# Patient Record
Sex: Male | Born: 1960 | Hispanic: No | Marital: Married | Smoking: Never smoker
Health system: Southern US, Community
[De-identification: ages and names within clinical notes are randomized; demographics above are authoritative.]

## PROBLEM LIST (undated history)

## (undated) DIAGNOSIS — E119 Type 2 diabetes mellitus without complications: Secondary | ICD-10-CM

## (undated) DIAGNOSIS — A048 Other specified bacterial intestinal infections: Secondary | ICD-10-CM

## (undated) DIAGNOSIS — I10 Essential (primary) hypertension: Secondary | ICD-10-CM

## (undated) DIAGNOSIS — I119 Hypertensive heart disease without heart failure: Secondary | ICD-10-CM

## (undated) DIAGNOSIS — N419 Inflammatory disease of prostate, unspecified: Secondary | ICD-10-CM

## (undated) DIAGNOSIS — E785 Hyperlipidemia, unspecified: Secondary | ICD-10-CM

## (undated) HISTORY — DX: Inflammatory disease of prostate, unspecified: N41.9

## (undated) HISTORY — DX: Essential (primary) hypertension: I10

## (undated) HISTORY — DX: Other specified bacterial intestinal infections: A04.8

## (undated) HISTORY — DX: Hypertensive heart disease without heart failure: I11.9

## (undated) HISTORY — PX: ESOPHAGOGASTRODUODENOSCOPY: SHX1529

## (undated) HISTORY — PX: CATARACT EXTRACTION, BILATERAL: SHX1313

## (undated) HISTORY — PX: COLONOSCOPY: SHX174

## (undated) HISTORY — DX: Hyperlipidemia, unspecified: E78.5

## (undated) HISTORY — DX: Type 2 diabetes mellitus without complications: E11.9

## (undated) HISTORY — PX: NOSE SURGERY: SHX723

---

## 2016-05-28 DIAGNOSIS — L578 Other skin changes due to chronic exposure to nonionizing radiation: Secondary | ICD-10-CM | POA: Diagnosis not present

## 2016-05-28 DIAGNOSIS — D225 Melanocytic nevi of trunk: Secondary | ICD-10-CM | POA: Diagnosis not present

## 2016-05-28 DIAGNOSIS — D485 Neoplasm of uncertain behavior of skin: Secondary | ICD-10-CM | POA: Diagnosis not present

## 2016-06-27 DIAGNOSIS — H01004 Unspecified blepharitis left upper eyelid: Secondary | ICD-10-CM | POA: Diagnosis not present

## 2016-06-27 DIAGNOSIS — E119 Type 2 diabetes mellitus without complications: Secondary | ICD-10-CM | POA: Diagnosis not present

## 2016-06-27 DIAGNOSIS — H01001 Unspecified blepharitis right upper eyelid: Secondary | ICD-10-CM | POA: Diagnosis not present

## 2016-06-27 DIAGNOSIS — H25813 Combined forms of age-related cataract, bilateral: Secondary | ICD-10-CM | POA: Diagnosis not present

## 2016-07-23 DIAGNOSIS — G4730 Sleep apnea, unspecified: Secondary | ICD-10-CM | POA: Diagnosis not present

## 2016-07-26 DIAGNOSIS — Z23 Encounter for immunization: Secondary | ICD-10-CM | POA: Diagnosis not present

## 2016-07-31 DIAGNOSIS — E119 Type 2 diabetes mellitus without complications: Secondary | ICD-10-CM | POA: Diagnosis not present

## 2016-07-31 DIAGNOSIS — Z6827 Body mass index (BMI) 27.0-27.9, adult: Secondary | ICD-10-CM | POA: Diagnosis not present

## 2016-07-31 DIAGNOSIS — E785 Hyperlipidemia, unspecified: Secondary | ICD-10-CM | POA: Diagnosis not present

## 2016-07-31 DIAGNOSIS — I10 Essential (primary) hypertension: Secondary | ICD-10-CM | POA: Diagnosis not present

## 2016-08-28 DIAGNOSIS — G4733 Obstructive sleep apnea (adult) (pediatric): Secondary | ICD-10-CM | POA: Diagnosis not present

## 2016-09-28 DIAGNOSIS — G4733 Obstructive sleep apnea (adult) (pediatric): Secondary | ICD-10-CM | POA: Diagnosis not present

## 2016-10-28 DIAGNOSIS — G4733 Obstructive sleep apnea (adult) (pediatric): Secondary | ICD-10-CM | POA: Diagnosis not present

## 2016-11-29 DIAGNOSIS — G4733 Obstructive sleep apnea (adult) (pediatric): Secondary | ICD-10-CM | POA: Diagnosis not present

## 2017-01-06 DIAGNOSIS — I10 Essential (primary) hypertension: Secondary | ICD-10-CM | POA: Diagnosis not present

## 2017-01-06 DIAGNOSIS — E785 Hyperlipidemia, unspecified: Secondary | ICD-10-CM | POA: Diagnosis not present

## 2017-01-06 DIAGNOSIS — E119 Type 2 diabetes mellitus without complications: Secondary | ICD-10-CM | POA: Diagnosis not present

## 2017-08-18 DIAGNOSIS — Z23 Encounter for immunization: Secondary | ICD-10-CM | POA: Diagnosis not present

## 2017-08-18 DIAGNOSIS — E785 Hyperlipidemia, unspecified: Secondary | ICD-10-CM | POA: Diagnosis not present

## 2017-08-18 DIAGNOSIS — Z0000 Encounter for general adult medical examination without abnormal findings: Secondary | ICD-10-CM | POA: Diagnosis not present

## 2017-08-18 DIAGNOSIS — E119 Type 2 diabetes mellitus without complications: Secondary | ICD-10-CM | POA: Diagnosis not present

## 2017-08-18 DIAGNOSIS — I10 Essential (primary) hypertension: Secondary | ICD-10-CM | POA: Diagnosis not present

## 2017-08-19 DIAGNOSIS — H01001 Unspecified blepharitis right upper eyelid: Secondary | ICD-10-CM | POA: Diagnosis not present

## 2017-08-19 DIAGNOSIS — H25813 Combined forms of age-related cataract, bilateral: Secondary | ICD-10-CM | POA: Diagnosis not present

## 2017-08-19 DIAGNOSIS — H01004 Unspecified blepharitis left upper eyelid: Secondary | ICD-10-CM | POA: Diagnosis not present

## 2017-08-19 DIAGNOSIS — E119 Type 2 diabetes mellitus without complications: Secondary | ICD-10-CM | POA: Diagnosis not present

## 2018-01-02 DIAGNOSIS — R5383 Other fatigue: Secondary | ICD-10-CM | POA: Diagnosis not present

## 2018-01-02 DIAGNOSIS — J329 Chronic sinusitis, unspecified: Secondary | ICD-10-CM | POA: Diagnosis not present

## 2018-05-26 DIAGNOSIS — E119 Type 2 diabetes mellitus without complications: Secondary | ICD-10-CM | POA: Diagnosis not present

## 2018-05-26 DIAGNOSIS — L84 Corns and callosities: Secondary | ICD-10-CM | POA: Diagnosis not present

## 2018-05-26 DIAGNOSIS — B353 Tinea pedis: Secondary | ICD-10-CM | POA: Diagnosis not present

## 2018-06-11 ENCOUNTER — Ambulatory Visit: Payer: BLUE CROSS/BLUE SHIELD | Admitting: Sports Medicine

## 2018-06-18 ENCOUNTER — Ambulatory Visit: Payer: BLUE CROSS/BLUE SHIELD | Admitting: Sports Medicine

## 2018-06-18 ENCOUNTER — Encounter: Payer: Self-pay | Admitting: Sports Medicine

## 2018-06-18 VITALS — Ht 67.0 in | Wt 163.0 lb

## 2018-06-18 DIAGNOSIS — M79672 Pain in left foot: Secondary | ICD-10-CM

## 2018-06-18 DIAGNOSIS — Q828 Other specified congenital malformations of skin: Secondary | ICD-10-CM | POA: Diagnosis not present

## 2018-06-18 DIAGNOSIS — E119 Type 2 diabetes mellitus without complications: Secondary | ICD-10-CM

## 2018-06-18 NOTE — Progress Notes (Signed)
   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   MRN: [id number]  HPI    Review of Systems  All other systems reviewed and are negative.      Objective:   Physical Exam        Assessment & Plan:

## 2018-06-18 NOTE — Progress Notes (Signed)
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 Patient complains of pain at the lesion present at the ball of his left foot.  Patient has tried good supportive shoes however states that the pain is pretty sharp especially when attempting to walk barefoot and seems to improve when he was wearing shoes.  Patient denies warmth, redness, swelling, drainage, any acute symptoms focal to the area of concern.  Does admit to some dry skin to the area but otherwise denies any other changes to the area.  Patient denies any other pedal complaints.   Review of Systems  Skin:       Lesion left foot  All other systems reviewed and are negative.    There are no active problems to display for this patient.   Current Outpatient Medications on File Prior to Visit  Medication Sig Dispense Refill  . lisinopril (PRINIVIL,ZESTRIL) 10 MG tablet Take 10 mg by mouth daily.    . metformin (FORTAMET) 500 MG (OSM) 24 hr tablet Take 500 mg by mouth daily with breakfast.    . Semaglutide (OZEMPIC [place]) Inject into the skin.     No current facility-administered medications on file prior to visit.     Not on File  Objective:  General: Alert and oriented x3 in no acute distress  Dermatology: Keratotic lesion present submet 3 on left foot with skin lines transversing the lesion, pain is present with direct pressure to the lesion with a central nucleated core noted, there is minimal dry skin surrounding plantarly bilateral without pruritus, no webspace macerations, no ecchymosis bilateral, all nails x 10 are well manicured.  Vascular: Dorsalis Pedis and Posterior Tibial pedal pulses 2/4, Capillary Fill Time 3 seconds, + pedal hair growth bilateral, no edema bilateral lower extremities, Temperature gradient within normal limits.  Neurology: Johney Maine sensation intact via light touch bilateral.  Protective sensation intact with Sims  Weinstein monofilament bilateral.  Vibratory intact bilateral.  Musculoskeletal: Mild tenderness with palpation at the keratotic lesion site on Left, Muscular strength 5/5 in all groups without pain or limitation on range of motion.  Mild fat pad displacement noted bilaterally.  Assessment and Plan: Problem List Items Addressed This Visit    None    Visit Diagnoses    Porokeratosis    -  Primary   Left foot pain       Diabetes mellitus without complication (HCC)       Relevant Medications   lisinopril (PRINIVIL,ZESTRIL) 10 MG tablet   metformin (FORTAMET) 500 MG (OSM) 24 hr tablet   Semaglutide (OZEMPIC [place])     -Complete examination performed -Discussed treatment options -Parred keratoic lesion using a chisel blade; treated the area with Salinocaine covered with Band-Aid -Encouraged daily skin emollients -Encouraged use of pumice stone -Advised good supportive shoes and inserts -Patient to return to office as needed or sooner if condition worsens.  Landis Martins, DPM

## 2018-07-27 DIAGNOSIS — L237 Allergic contact dermatitis due to plants, except food: Secondary | ICD-10-CM | POA: Diagnosis not present

## 2018-08-26 ENCOUNTER — Encounter: Payer: Self-pay | Admitting: Student

## 2018-10-13 DIAGNOSIS — E785 Hyperlipidemia, unspecified: Secondary | ICD-10-CM | POA: Diagnosis not present

## 2018-10-13 DIAGNOSIS — H01004 Unspecified blepharitis left upper eyelid: Secondary | ICD-10-CM | POA: Diagnosis not present

## 2018-10-13 DIAGNOSIS — E119 Type 2 diabetes mellitus without complications: Secondary | ICD-10-CM | POA: Diagnosis not present

## 2018-10-13 DIAGNOSIS — Z0000 Encounter for general adult medical examination without abnormal findings: Secondary | ICD-10-CM | POA: Diagnosis not present

## 2018-10-13 DIAGNOSIS — H01001 Unspecified blepharitis right upper eyelid: Secondary | ICD-10-CM | POA: Diagnosis not present

## 2018-10-13 DIAGNOSIS — H25813 Combined forms of age-related cataract, bilateral: Secondary | ICD-10-CM | POA: Diagnosis not present

## 2018-10-13 DIAGNOSIS — I10 Essential (primary) hypertension: Secondary | ICD-10-CM | POA: Diagnosis not present

## 2018-10-13 DIAGNOSIS — Z1331 Encounter for screening for depression: Secondary | ICD-10-CM | POA: Diagnosis not present

## 2018-10-19 DIAGNOSIS — R7989 Other specified abnormal findings of blood chemistry: Secondary | ICD-10-CM | POA: Diagnosis not present

## 2018-10-19 DIAGNOSIS — D649 Anemia, unspecified: Secondary | ICD-10-CM | POA: Diagnosis not present

## 2018-12-15 ENCOUNTER — Other Ambulatory Visit: Payer: Self-pay

## 2018-12-15 ENCOUNTER — Encounter: Payer: Self-pay | Admitting: Family

## 2018-12-15 ENCOUNTER — Ambulatory Visit (INDEPENDENT_AMBULATORY_CARE_PROVIDER_SITE_OTHER): Payer: BLUE CROSS/BLUE SHIELD | Admitting: Family

## 2018-12-15 DIAGNOSIS — Z9189 Other specified personal risk factors, not elsewhere classified: Secondary | ICD-10-CM | POA: Diagnosis not present

## 2018-12-15 DIAGNOSIS — Z789 Other specified health status: Secondary | ICD-10-CM | POA: Diagnosis not present

## 2018-12-15 DIAGNOSIS — Z7189 Other specified counseling: Secondary | ICD-10-CM

## 2018-12-15 DIAGNOSIS — Z7184 Encounter for health counseling related to travel: Secondary | ICD-10-CM | POA: Diagnosis not present

## 2018-12-15 DIAGNOSIS — Z7185 Encounter for immunization safety counseling: Secondary | ICD-10-CM

## 2018-12-15 DIAGNOSIS — Z23 Encounter for immunization: Secondary | ICD-10-CM

## 2018-12-15 DIAGNOSIS — Z298 Encounter for other specified prophylactic measures: Secondary | ICD-10-CM

## 2018-12-15 HISTORY — DX: Encounter for health counseling related to travel: Z71.84

## 2018-12-15 MED ORDER — ATOVAQUONE-PROGUANIL HCL 250-100 MG PO TABS: ORAL_TABLET | ORAL | 0 refills | Status: DC

## 2018-12-15 NOTE — Progress Notes (Signed)
Subjective:    Patient ID: Cristian Brothers, [name], male    DOB: [birth year], 58 y.o.   MRN: [id number]  Chief Complaint  Patient presents with  . Travel Consult    [place] 2/7 - 2/23    HPI:  Cristian Brothers, [name] is a 58 y.o. male who presents today for travel consultation.   [name] is planning on traveling to [place] from February 7th - February 23rd for vacation and to visit family. He is planning on staying in private homes as well as hotels during her stay. Hhe has travelled extensively outside the [place] in the past. Due for Typhoid and Hepatitis A vaccination as well as Malaria prevention. Hhe will get Travelers' Diarrhea treatment in [place] if he needs it.   No Known Allergies    Outpatient Medications Prior to Visit  Medication Sig Dispense Refill  . lisinopril (PRINIVIL,ZESTRIL) 10 MG tablet Take 10 mg by mouth daily.    . metformin (FORTAMET) 500 MG (OSM) 24 hr tablet Take 500 mg by mouth daily with breakfast.    . Semaglutide (OZEMPIC [place]) Inject into the skin.     No facility-administered medications prior to visit.      No past medical history on file.    No past surgical history on file.    No family history on file.    Social History   Socioeconomic History  . Marital status: Married    Spouse name: Not on file  . Number of children: Not on file  . Years of education: Not on file  . Highest education level: Not on file  Occupational History  . Not on file  Social Needs  . Financial resource strain: Not on file  . Food insecurity:    Worry: Not on file    Inability: Not on file  . Transportation needs:    Medical: Not on file    Non-medical: Not on file  Tobacco Use  . Smoking status: Never Smoker  . Smokeless tobacco: Never Used  Substance and Sexual Activity  . Alcohol use: Not on file  . Drug use: Not on file  . Sexual activity: Not on file  Lifestyle  . Physical activity:    Days per week: Not on file    Minutes per session: Not on  file  . Stress: Not on file  Relationships  . Social connections:    Talks on phone: Not on file    Gets together: Not on file    Attends religious service: Not on file    Active member of club or organization: Not on file    Attends meetings of clubs or organizations: Not on file    Relationship status: Not on file  . Intimate partner violence:    Fear of current or ex partner: Not on file    Emotionally abused: Not on file    Physically abused: Not on file    Forced sexual activity: Not on file  Other Topics Concern  . Not on file  Social History Narrative  . Not on file       Objective:    There were no vitals taken for this visit. Nursing note and vital signs reviewed.  Physical Exam Constitutional:      General: He is not in acute distress.    Appearance: He is well-developed.  Cardiovascular:     Rate and Rhythm: Normal rate and regular rhythm.     Heart sounds: Normal heart sounds.  Pulmonary:  Effort: Pulmonary effort is normal.     Breath sounds: Normal breath sounds.  Skin:    General: Skin is warm and dry.  Neurological:     Mental Status: He is alert and oriented to person, place, and time.  Psychiatric:        Behavior: Behavior normal.        Thought Content: Thought content normal.        Judgment: Judgment normal.         Assessment & Plan:   Problem List Items Addressed This Visit      Other   Travel advice encounter - Primary    There are no contraindications to travel.   Issues discussed: environmental concerns, freshwater swimming, future shots, insect-borne illnesses, malaria, motion sickness, MVA safety, rabies, safe food/water, traveler's diarrhea, website/handouts for more information, what to do if ill upon return and what to do if ill while there.  Malaria prophylaxis: malarone, daily dose starting 1-2 days before entering endemic area, ending 7 days after leaving area Traveler's diarrhea prophylaxis: azithromycin.       Relevant Medications   atovaquone-proguanil (MALARONE) 250-100 MG TABS tablet   Other Relevant Orders   Typhoid VICPS vaccine im (Completed)   Hepatitis A vaccine adult IM (Completed)    Other Visit Diagnoses    History of foreign travel       Relevant Medications   atovaquone-proguanil (MALARONE) 250-100 MG TABS tablet   Other Relevant Orders   Typhoid VICPS vaccine im (Completed)   Hepatitis A vaccine adult IM (Completed)   At risk for infectious disease due to recent foreign travel       Relevant Medications   atovaquone-proguanil (MALARONE) 250-100 MG TABS tablet   Other Relevant Orders   Typhoid VICPS vaccine im (Completed)   Hepatitis A vaccine adult IM (Completed)   Need for hepatitis A immunization       Relevant Orders   Hepatitis A vaccine adult IM (Completed)   Need for immunization against typhoid       Relevant Orders   Typhoid VICPS vaccine im (Completed)   Need for malaria prophylaxis       Relevant Medications   atovaquone-proguanil (MALARONE) 250-100 MG TABS tablet   Vaccine counseling       Relevant Medications   atovaquone-proguanil (MALARONE) 250-100 MG TABS tablet   Other Relevant Orders   Typhoid VICPS vaccine im (Completed)   Hepatitis A vaccine adult IM (Completed)       I am having Cristian Brothers, [name] start on atovaquone-proguanil. I am also having him maintain his lisinopril, metformin, and Semaglutide (OZEMPIC [place]).   Meds ordered this encounter  Medications  . atovaquone-proguanil (MALARONE) 250-100 MG TABS tablet    Sig: Take 1 tablet by mouth daily starting 2 days prior to travel until 7 days after return.    Dispense:  28 tablet    Refill:  0    Order Specific Question:   Supervising Provider    Answer:   Carlyle Basques [[id number]]     Follow-up: No follow-ups on file.    Terri Piedra, MSN, FNP-C Nurse Practitioner [facility] Office phone: [phone] Pager: Fernando Salinas  number: [phone]

## 2018-12-15 NOTE — Patient Instructions (Addendum)
[facility]                [address] [place]                   [place], [place] [address]                      Phone: [phone]                        Fax: [phone]   Planned departure date: February 7        Planned return date: February 23 Countries of travel: [place]   Guidelines for the Prevention & Treatment of Traveler's Diarrhea  Prevention: "Boil it, Peel it, [place] it, or Forget it"   the fewer chances -> lower risk: try to stick to food & water precautions as much as possible"   If it's "piping hot"; it is probably okay, if not, it may not be   Treatment   1) You should always take care to drink lots of fluids in order to avoid dehydration   2) You should bring medications with you in case you come down with a case of diarrhea   3) OTC = bring pepto-bismol - can take with initial abdominal symptoms;                    Imodium - can help slow down your intestinal tract, can help relief cramps                    and diarrhea, can take if no bloody diarrhea  Use azithromycin if needed for traveler's diarrhea  Guidelines for the Prevention of Malaria  Avoidance:  -fewer mosquito bites = lower risk. Mosquitos can bite at night as well as daytime  -cover up (long sleeve clothing), mosquito nets, screens  -Insect repellent for your skin ( DEET containing lotion > 20%): for clothes ( permethrin spray) www.insectshield.com ([phone]) [phone]) for pretreated permethrin  On February 5th start malarone, daily dose starting 1-2 days before entering endemic area, ending 7 days after leaving area for malaria prevention.   Immunizations received today: Hepatitis A series and Typhoid (parenteral)  Future immunizations, if indicated Hepatitis A series   Prior to travel:  1) Be sure to pick up appropriate prescriptions, including medicine you take daily. Do not expect to be able to fill your prescriptions abroad.  2) Strongly  consider obtaining traveler's insurance, including emergency evacuation insurance. Most plans in the [place] do not cover participants abroad. (see below for resources)  3) Register at the appropriate [place] embassy or consulate with travel dates so they are aware of your presence in-country and for helpful advice during travel using the [employer] (STEP, GreenNylon.com.cy).  4) Leave contact information with a relative or friend.  5) Keep a Research officer, political party, credit cards in case they become lost or stolen  6) Inform your credit card company that you will be travelling abroad   During travel:  1) If you become ill and need medical advice, the [place] [employer]	[place] of the country you are traveling in general provides a list of [place] speaking doctors.  We are also available on MyChart for remote consultation if you register prior to travel. 2) Avoid motorcycles or scooters when at all possible. Traffic laws in many countries are lax and accidents occur frequently.  3) Do not take any unnecessary risks that you wouldn't do at home.   Resources:  -Country specific information: BlindResource.ca or GreenNylon.com.cy  -Press photographer (DEET, mosquito nets): [employer], [employer] store, [employer], [place] insurance options: gatewayplans.com; http://clayton-rivera.info/; travelguard.com or Good [place], gninsurance.com or info@gninsurance .com, [id number].   Post Travel:  If you return from your trip ill, call your primary care doctor or our travel clinic @ [phone].   Enjoy your trip and know that with proper pre-travel preparation, most people have an enjoyable and uninterrupted trip!

## 2018-12-15 NOTE — Assessment & Plan Note (Signed)
There are no contraindications to travel.   Issues discussed: environmental concerns, freshwater swimming, future shots, insect-borne illnesses, malaria, motion sickness, MVA safety, rabies, safe food/water, traveler's diarrhea, website/handouts for more information, what to do if ill upon return and what to do if ill while there.  Malaria prophylaxis: malarone, daily dose starting 1-2 days before entering endemic area, ending 7 days after leaving area Traveler's diarrhea prophylaxis: azithromycin.

## 2019-01-28 DIAGNOSIS — H02831 Dermatochalasis of right upper eyelid: Secondary | ICD-10-CM | POA: Diagnosis not present

## 2019-01-28 DIAGNOSIS — H2511 Age-related nuclear cataract, right eye: Secondary | ICD-10-CM | POA: Diagnosis not present

## 2019-01-28 DIAGNOSIS — H2513 Age-related nuclear cataract, bilateral: Secondary | ICD-10-CM | POA: Diagnosis not present

## 2019-01-28 DIAGNOSIS — H25013 Cortical age-related cataract, bilateral: Secondary | ICD-10-CM | POA: Diagnosis not present

## 2019-01-28 DIAGNOSIS — H18413 Arcus senilis, bilateral: Secondary | ICD-10-CM | POA: Diagnosis not present

## 2019-03-03 DIAGNOSIS — L723 Sebaceous cyst: Secondary | ICD-10-CM | POA: Diagnosis not present

## 2019-03-10 DIAGNOSIS — L720 Epidermal cyst: Secondary | ICD-10-CM | POA: Diagnosis not present

## 2019-03-10 DIAGNOSIS — L723 Sebaceous cyst: Secondary | ICD-10-CM | POA: Diagnosis not present

## 2019-04-13 DIAGNOSIS — H25811 Combined forms of age-related cataract, right eye: Secondary | ICD-10-CM | POA: Diagnosis not present

## 2019-04-13 DIAGNOSIS — H2511 Age-related nuclear cataract, right eye: Secondary | ICD-10-CM | POA: Diagnosis not present

## 2019-04-14 DIAGNOSIS — H2512 Age-related nuclear cataract, left eye: Secondary | ICD-10-CM | POA: Diagnosis not present

## 2019-04-14 DIAGNOSIS — H25042 Posterior subcapsular polar age-related cataract, left eye: Secondary | ICD-10-CM | POA: Diagnosis not present

## 2019-04-14 DIAGNOSIS — H25012 Cortical age-related cataract, left eye: Secondary | ICD-10-CM | POA: Diagnosis not present

## 2019-04-20 DIAGNOSIS — H2512 Age-related nuclear cataract, left eye: Secondary | ICD-10-CM | POA: Diagnosis not present

## 2019-04-20 DIAGNOSIS — H25812 Combined forms of age-related cataract, left eye: Secondary | ICD-10-CM | POA: Diagnosis not present

## 2019-04-29 DIAGNOSIS — Z1389 Encounter for screening for other disorder: Secondary | ICD-10-CM | POA: Diagnosis not present

## 2019-04-29 DIAGNOSIS — R634 Abnormal weight loss: Secondary | ICD-10-CM | POA: Diagnosis not present

## 2019-04-29 DIAGNOSIS — E785 Hyperlipidemia, unspecified: Secondary | ICD-10-CM | POA: Diagnosis not present

## 2019-04-29 DIAGNOSIS — E1169 Type 2 diabetes mellitus with other specified complication: Secondary | ICD-10-CM | POA: Diagnosis not present

## 2019-05-13 ENCOUNTER — Telehealth: Payer: Self-pay

## 2019-05-13 ENCOUNTER — Other Ambulatory Visit: Payer: Self-pay

## 2019-05-13 ENCOUNTER — Encounter: Payer: Self-pay | Admitting: Nurse Practitioner

## 2019-05-13 ENCOUNTER — Ambulatory Visit: Payer: BC Managed Care – PPO | Admitting: Nurse Practitioner

## 2019-05-13 VITALS — BP 110/70 | HR 90 | Temp 97.1°F | Ht 66.0 in | Wt 161.0 lb

## 2019-05-13 DIAGNOSIS — R634 Abnormal weight loss: Secondary | ICD-10-CM

## 2019-05-13 DIAGNOSIS — Z1211 Encounter for screening for malignant neoplasm of colon: Secondary | ICD-10-CM | POA: Diagnosis not present

## 2019-05-13 MED ORDER — NA SULFATE-K SULFATE-MG SULF 17.5-3.13-1.6 GM/177ML PO SOLN: ORAL | 0 refills | Status: DC

## 2019-05-13 NOTE — Telephone Encounter (Signed)
Covid-19 screening questions   Do you now or have you had a fever in the last 14 days? No  Do you have any respiratory symptoms of shortness of breath or cough now or in the last 14 days? No  Do you have any family members or close contacts with diagnosed or suspected Covid-19 in the past 14 days? No  Have you been tested for Covid-19 and found to be positive? No        

## 2019-05-13 NOTE — Patient Instructions (Signed)
If you are age 57 or older, your body mass index should be between 23-30. Your Body mass index is 25.99 kg/m. If this is out of the aforementioned range listed, please consider follow up with your Primary Care Provider.  If you are age 35 or younger, your body mass index should be between 19-25. Your Body mass index is 25.99 kg/m. If this is out of the aformentioned range listed, please consider follow up with your Primary Care Provider.   You have been scheduled for an endoscopy and colonoscopy. Please follow the written instructions given to you at your visit today. Please pick up your prep supplies at the pharmacy within the next 1-3 days. If you use inhalers (even only as needed), please bring them with you on the day of your procedure. Your physician has requested that you go to www.startemmi.com and enter the access code given to you at your visit today. This web site gives a general overview about your procedure. However, you should still follow specific instructions given to you by our office regarding your preparation for the procedure.  We have sent the following medications to your pharmacy for you to pick up at your convenience: Suprep  Thank you for choosing me and [facility].   Cristian Savoy, NP

## 2019-05-14 ENCOUNTER — Encounter: Payer: Self-pay | Admitting: Nurse Practitioner

## 2019-05-14 NOTE — Progress Notes (Signed)
ASSESSMENT / PLAN:   14. 58 yo male for colon cancer screening. He has unexplained weight loss but no other concerning features such as bowel changes or blood in stool. Normal hemoglobin.  -The risks and benefits of colonoscopy with possible polypectomy / biopsies were discussed and the patient agrees to proceed. Patient requests Dr. Lyndel Safe as primary GI.   2. Involuntary weight loss, possibly related to changes in diabetic meds. No focal GI symptoms but patient expresses concern  -Will schedule for EGD to be done at time of colonoscopy. .The risks and benefits of EGD were discussed and the patient agrees to proceed.  -If endoscopic evaluation negative consider CTscan for evaluation of weight loss  .  HPI:    Chief Complaint:   Weight loss. Colon cancer screening  Patient is a 58 yo male with DM, HTN, dyslipidemia and remote H.pylori infection, new to the practice, referred by PCP [name], MD for colon cancer screening but also evaluation of weight loss. His last colonoscopy was ~ 10 years ago in [facility]. He doesn't have any signs / symptoms of colon cancer.     Patient has lost involuntarily lost 20 pounds since April 2020. He hasn't had any N/V, weight loss. Some changes were made to diabetic meds, he was on Victoza then Ozempic. A couple of weeks ago his PCP decreased Ozempic dose to see if it would help. Patient has no focal symptoms but is concerned about the unexplained weight loss.   Data Reviewed:  PCP  Labs 04/30/19 WBC 8.4 Hgb 13.9 MCV 75.8 - [place] Platelets 215 BUN 103, Cr1.03 Liver tests nl TSH 1.72  Past Medical History:  Diagnosis Date  . Diabetes ([place])   . Dyslipidemia   . H. pylori infection    Treated in 2009 or 2010  . Hypertension   . Prostatitis      Past Surgical History:  Procedure Laterality Date  . CATARACT EXTRACTION, BILATERAL    . COLONOSCOPY     around 2009 or 2010. Was normal. [facility] [place]  . ESOPHAGOGASTRODUODENOSCOPY      Around 2009 or 2010. [facility] [place] Treated for H pylori  . NOSE SURGERY     Family History  Problem Relation Age of Onset  . Hypertension Mother   . CAD Father   . Diabetes Brother        x 2  . CAD Brother   . Breast cancer Cousin   . Colon cancer Neg Hx   . Esophageal cancer Neg Hx    Social History   Tobacco Use  . Smoking status: Never Smoker  . Smokeless tobacco: Never Used  Substance Use Topics  . Alcohol use: Never    Frequency: Never  . Drug use: Never   Current Outpatient Medications  Medication Sig Dispense Refill  . insulin aspart (NOVOLOG) 100 UNIT/ML injection Inject 5 Units into the skin 2 (two) times daily.     Marland Kitchen lisinopril (PRINIVIL,ZESTRIL) 10 MG tablet Take 10 mg by mouth daily.    . metformin (FORTAMET) 500 MG (OSM) 24 hr tablet Take 1,000 mg by mouth 2 (two) times daily.     . rosuvastatin (CRESTOR) 5 MG tablet Take 5 mg by mouth daily.    . Semaglutide (OZEMPIC [place]) Inject 0.25 mg into the skin once a week.     . Na Sulfate-K Sulfate-Mg Sulf 17.5-3.13-1.6 GM/177ML SOLN Suprep-Use as directed 354  mL 0   No current facility-administered medications for this visit.    No Known Allergies   Review of Systems: All systems reviewed and negative except where noted in HPI.   Creatinine clearance cannot be calculated (No successful lab value found.)   Physical Exam:    Wt Readings from Last 3 Encounters:  05/13/19 161 lb (73 kg)  06/18/18 163 lb (73.9 kg)    BP 110/70   Pulse 90   Temp (!) 97.1 F (36.2 C) (Temporal)   Ht 5\' 6"  (1.676 m)   Wt 161 lb (73 kg)   BMI 25.99 kg/m  Constitutional:  Pleasant male in no acute distress. Psychiatric: Normal mood and affect. Behavior is normal. EENT: Pupils normal.  Conjunctivae are normal. No scleral icterus. Neck supple.  Cardiovascular: Normal rate, regular rhythm. No edema Pulmonary/chest: Effort normal and breath sounds normal. No wheezing, rales or rhonchi. Abdominal: Soft, nondistended,  nontender. Bowel sounds active throughout. There are no masses palpable. No hepatomegaly. Neurological: Alert and oriented to person place and time. Skin: Skin is warm and dry. No rashes noted.  Tye Savoy, NP  05/14/2019, 5:27 PM  Cc: [name], MD

## 2019-05-19 ENCOUNTER — Encounter: Payer: Self-pay | Admitting: Gastroenterology

## 2019-05-24 ENCOUNTER — Telehealth: Payer: Self-pay | Admitting: Gastroenterology

## 2019-05-24 NOTE — Telephone Encounter (Signed)

## 2019-05-25 ENCOUNTER — Other Ambulatory Visit: Payer: Self-pay

## 2019-05-25 ENCOUNTER — Ambulatory Visit (AMBULATORY_SURGERY_CENTER): Payer: BC Managed Care – PPO | Admitting: Gastroenterology

## 2019-05-25 ENCOUNTER — Encounter: Payer: Self-pay | Admitting: Gastroenterology

## 2019-05-25 VITALS — BP 115/66 | HR 57 | Temp 97.6°F | Resp 11 | Ht 66.0 in | Wt 161.0 lb

## 2019-05-25 DIAGNOSIS — D122 Benign neoplasm of ascending colon: Secondary | ICD-10-CM

## 2019-05-25 DIAGNOSIS — D120 Benign neoplasm of cecum: Secondary | ICD-10-CM | POA: Diagnosis not present

## 2019-05-25 DIAGNOSIS — D123 Benign neoplasm of transverse colon: Secondary | ICD-10-CM | POA: Diagnosis not present

## 2019-05-25 DIAGNOSIS — R634 Abnormal weight loss: Secondary | ICD-10-CM | POA: Diagnosis not present

## 2019-05-25 DIAGNOSIS — D129 Benign neoplasm of anus and anal canal: Secondary | ICD-10-CM

## 2019-05-25 DIAGNOSIS — D128 Benign neoplasm of rectum: Secondary | ICD-10-CM | POA: Diagnosis not present

## 2019-05-25 DIAGNOSIS — K2970 Gastritis, unspecified, without bleeding: Secondary | ICD-10-CM | POA: Diagnosis not present

## 2019-05-25 DIAGNOSIS — D124 Benign neoplasm of descending colon: Secondary | ICD-10-CM | POA: Diagnosis not present

## 2019-05-25 DIAGNOSIS — Z1211 Encounter for screening for malignant neoplasm of colon: Secondary | ICD-10-CM | POA: Diagnosis not present

## 2019-05-25 DIAGNOSIS — K208 Other esophagitis: Secondary | ICD-10-CM | POA: Diagnosis not present

## 2019-05-25 DIAGNOSIS — K210 Gastro-esophageal reflux disease with esophagitis: Secondary | ICD-10-CM

## 2019-05-25 MED ORDER — PANTOPRAZOLE SODIUM 40 MG PO TBEC: 40.0000 mg | DELAYED_RELEASE_TABLET | Freq: Every day | ORAL | 0 refills | Status: DC

## 2019-05-25 MED ORDER — SODIUM CHLORIDE 0.9 % IV SOLN: 500.0000 mL | Freq: Once | INTRAVENOUS | Status: DC

## 2019-05-25 NOTE — Progress Notes (Signed)
Called to room to assist during endoscopic procedure.  Patient ID and intended procedure confirmed with present staff. Received instructions for my participation in the procedure from the performing physician.  

## 2019-05-25 NOTE — Op Note (Signed)
[facility] Patient Name: [name] Procedure Date: 05/25/2019 3:02 PM MRN: [id number] Endoscopist: Jackquline Denmark , MD Age: 58 Referring MD:  Date of Birth: Oct 22, 1961 Gender: Male Account #: [id number] Procedure:                Upper GI endoscopy Indications:              Weight loss. Medicines:                Monitored Anesthesia Care Procedure:                Pre-Anesthesia Assessment:                           - Prior to the procedure, a History and Physical                            was performed, and patient medications and                            allergies were reviewed. The patient's tolerance of                            previous anesthesia was also reviewed. The risks                            and benefits of the procedure and the sedation                            options and risks were discussed with the patient.                            All questions were answered, and informed consent                            was obtained. Prior Anticoagulants: The patient has                            taken no previous anticoagulant or antiplatelet                            agents. ASA Grade Assessment: I - A normal, healthy                            patient. After reviewing the risks and benefits,                            the patient was deemed in satisfactory condition to                            undergo the procedure.                           After obtaining informed consent, the endoscope was  passed under direct vision. Throughout the                            procedure, the patient's blood pressure, pulse, and                            oxygen saturations were monitored continuously. The                            Endoscope was introduced through the mouth, and                            advanced to the second part of duodenum. The upper                            GI endoscopy was accomplished without difficulty.                 The patient tolerated the procedure well. Scope In: Scope Out: Findings:                 The Z-line was slightly irregular and was found 36                            cm from the incisors. Examined by narrowband                            imaging. Biopsies were taken with a cold forceps                            for histology. Estimated blood loss: none.                           Localized minimal inflammation characterized by                            erythema was found in the gastric antrum. Biopsies                            were taken with a cold forceps for Helicobacter                            pylori testing. Estimated blood loss: none.                           The examined duodenum was normal. Biopsies for                            histology were taken with a cold forceps for                            evaluation of celiac disease. Estimated blood loss:                            none. Complications:  No immediate complications. Estimated Blood Loss:     Estimated blood loss: none. Impression:               -Minimally irregular Z-line irregular.                           -Minimal gastritis. Recommendation:           - Patient has a contact number available for                            emergencies. The signs and symptoms of potential                            delayed complications were discussed with the                            patient. Return to normal activities tomorrow.                            Written discharge instructions were provided to the                            patient.                           - Resume previous diet.                           - Continue present medications.                           - Protonix 40 mg p.o. once a day, half an hour                            before breakfast, for 4 weeks.                           - Await pathology results.                           - Return to GI clinic in 4 weeks. If any  further                            weight loss, CT abdomen/pelvis followed by                            solid-phase gastric emptying scan to r/o diabetic                            gastroparesis. Jackquline Denmark, MD 05/25/2019 3:58:10 PM This report has been signed electronically.

## 2019-05-25 NOTE — Patient Instructions (Signed)
Handouts given for polyps, hemorrhoids and gastritis.  No aspirin, ibuprofen, naproxen or other NSAIDS for 7 days.  YOU HAD AN ENDOSCOPIC PROCEDURE TODAY AT [facility]:   Refer to the procedure report that was given to you for any specific questions about what was found during the examination.  If the procedure report does not answer your questions, please call your gastroenterologist to clarify.  If you requested that your care partner not be given the details of your procedure findings, then the procedure report has been included in a sealed envelope for you to review at your convenience later.  YOU SHOULD EXPECT: Some feelings of bloating in the abdomen. Passage of more gas than usual.  Walking can help get rid of the air that was put into your GI tract during the procedure and reduce the bloating. If you had a lower endoscopy (such as a colonoscopy or flexible sigmoidoscopy) you may notice spotting of blood in your stool or on the toilet paper. If you underwent a bowel prep for your procedure, you may not have a normal bowel movement for a few days.  Please Note:  You might notice some irritation and congestion in your nose or some drainage.  This is from the oxygen used during your procedure.  There is no need for concern and it should clear up in a day or so.  SYMPTOMS TO REPORT IMMEDIATELY:   Following lower endoscopy (colonoscopy or flexible sigmoidoscopy):  Excessive amounts of blood in the stool  Significant tenderness or worsening of abdominal pains  Swelling of the abdomen that is new, acute  Fever of 100F or higher   Following upper endoscopy (EGD)  Vomiting of blood or coffee ground material  New chest pain or pain under the shoulder blades  Painful or persistently difficult swallowing  New shortness of breath  Fever of 100F or higher  Black, tarry-looking stools  For urgent or emergent issues, a gastroenterologist can be reached at any hour by calling  [phone].   DIET:  We do recommend a small meal at first, but then you may proceed to your regular diet.  Drink plenty of fluids but you should avoid alcoholic beverages for 24 hours.  ACTIVITY:  You should plan to take it easy for the rest of today and you should NOT DRIVE or use heavy machinery until tomorrow (because of the sedation medicines used during the test).    FOLLOW UP: Our staff will call the number listed on your records 48-72 hours following your procedure to check on you and address any questions or concerns that you may have regarding the information given to you following your procedure. If we do not reach you, we will leave a message.  We will attempt to reach you two times.  During this call, we will ask if you have developed any symptoms of COVID 19. If you develop any symptoms (ie: fever, flu-like symptoms, shortness of breath, cough etc.) before then, please call [phone].  If you test positive for Covid 19 in the 2 weeks post procedure, please call and report this information to [place].    If any biopsies were taken you will be contacted by phone or by letter within the next 1-3 weeks.  Please call us at [phone] if you have not heard about the biopsies in 3 weeks.    SIGNATURES/CONFIDENTIALITY: You and/or your care partner have signed paperwork which will be entered into your electronic medical record.  These signatures  attest to the fact that that the information above on your After Visit Summary has been reviewed and is understood.  Full responsibility of the confidentiality of this discharge information lies with you and/or your care-partner.

## 2019-05-25 NOTE — Op Note (Addendum)
[place] Patient Name: [name] Procedure Date: 05/25/2019 3:01 PM MRN: [id number] Endoscopist: Jackquline Denmark , MD Age: 58 Referring MD:  Date of Birth: [birth year] Gender: Male Account #: [id number] Procedure:                Colonoscopy Indications:              Screening for colorectal malignant neoplasm. Medicines:                Monitored Anesthesia Care Procedure:                Pre-Anesthesia Assessment:                           - Prior to the procedure, a History and Physical                            was performed, and patient medications and                            allergies were reviewed. The patient's tolerance of                            previous anesthesia was also reviewed. The risks                            and benefits of the procedure and the sedation                            options and risks were discussed with the patient.                            All questions were answered, and informed consent                            was obtained. Prior Anticoagulants: The patient has                            taken no previous anticoagulant or antiplatelet                            agents. ASA Grade Assessment: I - A normal, healthy                            patient. After reviewing the risks and benefits,                            the patient was deemed in satisfactory condition to                            undergo the procedure.                           After obtaining informed consent, the colonoscope  was passed under direct vision. Throughout the                            procedure, the patient's blood pressure, pulse, and                            oxygen saturations were monitored continuously. The                            Model PCF-[id number]) scope was introduced                            through the anus and advanced to the 4 cm into the                            ileum. The colonoscopy was  performed without                            difficulty. The patient tolerated the procedure                            well. The quality of the bowel preparation was                            excellent. The terminal ileum, ileocecal valve,                            appendiceal orifice, and rectum were photographed. Scope In: 3:20:17 PM Scope Out: 3:49:53 PM Scope Withdrawal Time: 0 hours 27 minutes 10 seconds  Total Procedure Duration: 0 hours 29 minutes 36 seconds  Findings:                 A 6 mm polyp was found in the cecum. The polyp was                            sessile. The polyp was removed with a cold snare.                            Resection and retrieval were complete.                           Two sessile polyps were found in the proximal                            ascending colon. The polyps were 8 to 10 mm in                            size. These polyps were removed with a hot snare.                            Resection and retrieval were complete. Estimated  blood loss: none.                           Two sessile polyps were found in the mid transverse                            colon. The polyps were 6 to 8 mm in size. These                            polyps were removed with a cold snare. Resection                            and retrieval were complete. Estimated blood loss:                            none.                           Two sessile polyps were found in the proximal                            descending colon. The polyps were 6 mm in size.                            These polyps were removed with a cold snare.                            Resection and retrieval were complete. Estimated                            blood loss: none.                           Two polyps were found in the rectum. The larger                            polyp was semi-pedunculated, 12 mm in size, just                            above the dentate line in  posterior rectum. Removed                            by hot snare. Smaller polyp was 6 mm. Also removed                            with hot snare. Resection and retrieval were                            complete. Estimated blood loss: none.                           Non-bleeding internal hemorrhoids were found during  retroflexion. The hemorrhoids were small.                           The terminal ileum appeared normal.                           The exam was otherwise without abnormality on                            direct and retroflexion views. Complications:            No immediate complications. Estimated Blood Loss:     Estimated blood loss: none. Impression:               -Colonic polyps (9) s/p polypectomy                           -Small internal hemorrhoids.                           -Otherwise normal colonoscopy to TI. Recommendation:           - Patient has a contact number available for                            emergencies. The signs and symptoms of potential                            delayed complications were discussed with the                            patient. Return to normal activities tomorrow.                            Written discharge instructions were provided to the                            patient.                           - Resume previous diet.                           - Continue present medications.                           - No aspirin, ibuprofen, naproxen, or other                            non-steroidal anti-inflammatory drugs for 7 days                            after polyp removal.                           - Await pathology results.                           -  Repeat colonoscopy for surveillance based on                            pathology results.                           - Return to GI clinic in 4 weeks. Please see EGD                            note for the plan. Jackquline Denmark, MD 05/25/2019 4:10:44 PM This  report has been signed electronically.

## 2019-05-25 NOTE — Progress Notes (Signed)
Report to PACU, RN, vss, BBS= Clear.  

## 2019-05-25 NOTE — Progress Notes (Signed)
Pt's states no medical or surgical changes since previsit or office visit. 

## 2019-05-27 ENCOUNTER — Telehealth: Payer: Self-pay | Admitting: *Deleted

## 2019-05-27 NOTE — Telephone Encounter (Signed)
  Follow up Call-  Call back number 05/25/2019  Post procedure Call Back phone  # [phone]  Permission to leave phone message Yes     Patient questions:  Do you have a fever, pain , or abdominal swelling? No. Pain Score  0 *  Have you tolerated food without any problems? Yes.    Have you been able to return to your normal activities? Yes.    Do you have any questions about your discharge instructions: Diet   No. Medications  No. Follow up visit  No.  Do you have questions or concerns about your Care? No.  Actions: * If pain score is 4 or above: No action needed, pain <4.  1. Have you developed a fever since your procedure? No  2.   Have you had an respiratory symptoms (SOB or cough) since your procedure? NO 3.   Have you tested positive for COVID 19 since your procedure NO 4.   Have you had any family members/close contacts diagnosed with the COVID 19 since your procedure?  NO  If yes to any of these questions please route to Joylene John, RN and Alphonsa Gin, RN.

## 2019-05-30 ENCOUNTER — Encounter: Payer: Self-pay | Admitting: Gastroenterology

## 2019-06-01 ENCOUNTER — Other Ambulatory Visit: Payer: Self-pay

## 2019-06-01 DIAGNOSIS — R109 Unspecified abdominal pain: Secondary | ICD-10-CM

## 2019-06-01 DIAGNOSIS — R634 Abnormal weight loss: Secondary | ICD-10-CM

## 2019-06-04 ENCOUNTER — Other Ambulatory Visit: Payer: Self-pay

## 2019-06-04 DIAGNOSIS — R109 Unspecified abdominal pain: Secondary | ICD-10-CM

## 2019-06-04 NOTE — Progress Notes (Signed)
CT abd/pelvis with contrast scheduled for 06/11/2019 at 4:00 pm, 1st bottle of contrast at 2:00 pm, 2nd bottle of contrast at 3:00 pm; no solid foods after 12:00 noon; Stacy at [facility] CT will call and notify the patient of these instructions on 06/10/2019 prior to procedure;   Called and informed patient of instructions and need for BMP-patient reports he had lab work done about 3 weeks ago and he will fax these results to the office for review of BUN/creatinine levels for CT scan to be completed; Patient verbalized understanding of information/instructions; patient advised to call back to the office should questions/concerns arise;

## 2019-06-11 ENCOUNTER — Other Ambulatory Visit: Payer: Self-pay

## 2019-06-11 ENCOUNTER — Ambulatory Visit (INDEPENDENT_AMBULATORY_CARE_PROVIDER_SITE_OTHER)
Admission: RE | Admit: 2019-06-11 | Discharge: 2019-06-11 | Disposition: A | Discharge status: Home / Self Care | Payer: BC Managed Care – PPO | Source: Ambulatory Visit | Attending: Gastroenterology | Admitting: Gastroenterology

## 2019-06-11 DIAGNOSIS — N1330 Unspecified hydronephrosis: Secondary | ICD-10-CM | POA: Diagnosis not present

## 2019-06-11 DIAGNOSIS — R634 Abnormal weight loss: Secondary | ICD-10-CM

## 2019-06-11 DIAGNOSIS — N401 Enlarged prostate with lower urinary tract symptoms: Secondary | ICD-10-CM | POA: Diagnosis not present

## 2019-06-11 DIAGNOSIS — R109 Unspecified abdominal pain: Secondary | ICD-10-CM | POA: Diagnosis not present

## 2019-06-11 DIAGNOSIS — I700 Atherosclerosis of aorta: Secondary | ICD-10-CM | POA: Diagnosis not present

## 2019-06-11 MED ORDER — IOHEXOL 300 MG/ML  SOLN
100.0000 mL | Freq: Once | INTRAMUSCULAR | Status: AC | PRN
  Administered 2019-06-11: 100 mL via INTRAVENOUS

## 2019-06-17 ENCOUNTER — Other Ambulatory Visit: Payer: Self-pay | Admitting: Gastroenterology

## 2019-06-17 DIAGNOSIS — K2970 Gastritis, unspecified, without bleeding: Secondary | ICD-10-CM

## 2019-06-23 ENCOUNTER — Ambulatory Visit: Payer: BC Managed Care – PPO | Admitting: Gastroenterology

## 2019-07-16 ENCOUNTER — Other Ambulatory Visit: Payer: Self-pay

## 2019-07-16 ENCOUNTER — Other Ambulatory Visit: Payer: Self-pay | Admitting: Gastroenterology

## 2019-07-16 DIAGNOSIS — K2970 Gastritis, unspecified, without bleeding: Secondary | ICD-10-CM

## 2019-07-16 MED ORDER — PANTOPRAZOLE SODIUM 40 MG PO TBEC: 40.0000 mg | DELAYED_RELEASE_TABLET | Freq: Every day | ORAL | 11 refills | Status: DC

## 2019-08-02 DIAGNOSIS — U071 COVID-19: Secondary | ICD-10-CM | POA: Diagnosis not present

## 2019-08-04 DIAGNOSIS — H26491 Other secondary cataract, right eye: Secondary | ICD-10-CM | POA: Diagnosis not present

## 2019-08-17 DIAGNOSIS — Z20828 Contact with and (suspected) exposure to other viral communicable diseases: Secondary | ICD-10-CM | POA: Diagnosis not present

## 2019-08-18 DIAGNOSIS — H26492 Other secondary cataract, left eye: Secondary | ICD-10-CM | POA: Diagnosis not present

## 2019-08-25 DIAGNOSIS — N401 Enlarged prostate with lower urinary tract symptoms: Secondary | ICD-10-CM | POA: Diagnosis not present

## 2019-08-25 DIAGNOSIS — R3912 Poor urinary stream: Secondary | ICD-10-CM | POA: Diagnosis not present

## 2019-08-25 DIAGNOSIS — N5201 Erectile dysfunction due to arterial insufficiency: Secondary | ICD-10-CM | POA: Diagnosis not present

## 2019-09-02 DIAGNOSIS — Z125 Encounter for screening for malignant neoplasm of prostate: Secondary | ICD-10-CM | POA: Diagnosis not present

## 2019-09-02 DIAGNOSIS — I10 Essential (primary) hypertension: Secondary | ICD-10-CM | POA: Diagnosis not present

## 2019-09-02 DIAGNOSIS — E1169 Type 2 diabetes mellitus with other specified complication: Secondary | ICD-10-CM | POA: Diagnosis not present

## 2019-09-02 DIAGNOSIS — E785 Hyperlipidemia, unspecified: Secondary | ICD-10-CM | POA: Diagnosis not present

## 2019-09-14 DIAGNOSIS — Z20828 Contact with and (suspected) exposure to other viral communicable diseases: Secondary | ICD-10-CM | POA: Diagnosis not present

## 2019-09-20 DIAGNOSIS — Z20828 Contact with and (suspected) exposure to other viral communicable diseases: Secondary | ICD-10-CM | POA: Diagnosis not present

## 2019-09-27 DIAGNOSIS — Z20828 Contact with and (suspected) exposure to other viral communicable diseases: Secondary | ICD-10-CM | POA: Diagnosis not present

## 2019-10-04 DIAGNOSIS — Z20828 Contact with and (suspected) exposure to other viral communicable diseases: Secondary | ICD-10-CM | POA: Diagnosis not present

## 2019-10-08 DIAGNOSIS — Z20828 Contact with and (suspected) exposure to other viral communicable diseases: Secondary | ICD-10-CM | POA: Diagnosis not present

## 2019-10-18 ENCOUNTER — Other Ambulatory Visit: Payer: Self-pay

## 2019-10-18 DIAGNOSIS — Z20822 Contact with and (suspected) exposure to covid-19: Secondary | ICD-10-CM

## 2019-10-20 LAB — NOVEL CORONAVIRUS, NAA: SARS-CoV-2, NAA: NOT DETECTED

## 2019-10-29 DIAGNOSIS — Z20828 Contact with and (suspected) exposure to other viral communicable diseases: Secondary | ICD-10-CM | POA: Diagnosis not present

## 2019-11-05 DIAGNOSIS — Z20828 Contact with and (suspected) exposure to other viral communicable diseases: Secondary | ICD-10-CM | POA: Diagnosis not present

## 2019-11-23 DIAGNOSIS — Z20828 Contact with and (suspected) exposure to other viral communicable diseases: Secondary | ICD-10-CM | POA: Diagnosis not present

## 2019-11-29 DIAGNOSIS — Z20828 Contact with and (suspected) exposure to other viral communicable diseases: Secondary | ICD-10-CM | POA: Diagnosis not present

## 2019-12-06 DIAGNOSIS — Z20828 Contact with and (suspected) exposure to other viral communicable diseases: Secondary | ICD-10-CM | POA: Diagnosis not present

## 2019-12-21 DIAGNOSIS — Z20822 Contact with and (suspected) exposure to covid-19: Secondary | ICD-10-CM | POA: Diagnosis not present

## 2019-12-27 DIAGNOSIS — Z20828 Contact with and (suspected) exposure to other viral communicable diseases: Secondary | ICD-10-CM | POA: Diagnosis not present

## 2020-01-03 DIAGNOSIS — Z1152 Encounter for screening for COVID-19: Secondary | ICD-10-CM | POA: Diagnosis not present

## 2020-01-11 DIAGNOSIS — Z20822 Contact with and (suspected) exposure to covid-19: Secondary | ICD-10-CM | POA: Diagnosis not present

## 2020-01-18 DIAGNOSIS — E119 Type 2 diabetes mellitus without complications: Secondary | ICD-10-CM | POA: Diagnosis not present

## 2020-02-08 DIAGNOSIS — Z1152 Encounter for screening for COVID-19: Secondary | ICD-10-CM | POA: Diagnosis not present

## 2020-02-29 DIAGNOSIS — M25521 Pain in right elbow: Secondary | ICD-10-CM | POA: Diagnosis not present

## 2020-05-08 DIAGNOSIS — R7989 Other specified abnormal findings of blood chemistry: Secondary | ICD-10-CM | POA: Diagnosis not present

## 2020-05-08 DIAGNOSIS — E119 Type 2 diabetes mellitus without complications: Secondary | ICD-10-CM | POA: Diagnosis not present

## 2020-05-08 DIAGNOSIS — I10 Essential (primary) hypertension: Secondary | ICD-10-CM | POA: Diagnosis not present

## 2020-05-09 DIAGNOSIS — Z961 Presence of intraocular lens: Secondary | ICD-10-CM | POA: Diagnosis not present

## 2020-05-09 DIAGNOSIS — H04121 Dry eye syndrome of right lacrimal gland: Secondary | ICD-10-CM | POA: Diagnosis not present

## 2020-05-09 DIAGNOSIS — H35373 Puckering of macula, bilateral: Secondary | ICD-10-CM | POA: Diagnosis not present

## 2020-05-09 DIAGNOSIS — E119 Type 2 diabetes mellitus without complications: Secondary | ICD-10-CM | POA: Diagnosis not present

## 2020-05-09 DIAGNOSIS — H0100A Unspecified blepharitis right eye, upper and lower eyelids: Secondary | ICD-10-CM | POA: Diagnosis not present

## 2020-06-20 DIAGNOSIS — H01004 Unspecified blepharitis left upper eyelid: Secondary | ICD-10-CM | POA: Diagnosis not present

## 2020-06-20 DIAGNOSIS — H01001 Unspecified blepharitis right upper eyelid: Secondary | ICD-10-CM | POA: Diagnosis not present

## 2020-06-20 DIAGNOSIS — H04123 Dry eye syndrome of bilateral lacrimal glands: Secondary | ICD-10-CM | POA: Diagnosis not present

## 2020-07-03 IMAGING — CT CT ABDOMEN AND PELVIS WITH CONTRAST
2 of 5 series · 15 of 46 positions shown, 17 images · IV contrast (OMNIPAQUE 300)
Comparison: None.

CLINICAL DATA: Left-sided abdominal pain. 10 lb weight loss over
past 2 months.

EXAM:
CT ABDOMEN AND PELVIS WITH CONTRAST
TECHNIQUE: Multidetector CT imaging of the abdomen and pelvis was performed
using the standard protocol following bolus administration of
intravenous contrast.
CONTRAST:  100mL OMNIPAQUE IOHEXOL 300 MG/ML  SOLN

[Series 2: abd/pel w · axial · 0.72mm/px · z∈[-475,-40]mm · 12 of 97 slices shown, 14 images]
[im 5/97  soft-tissue]
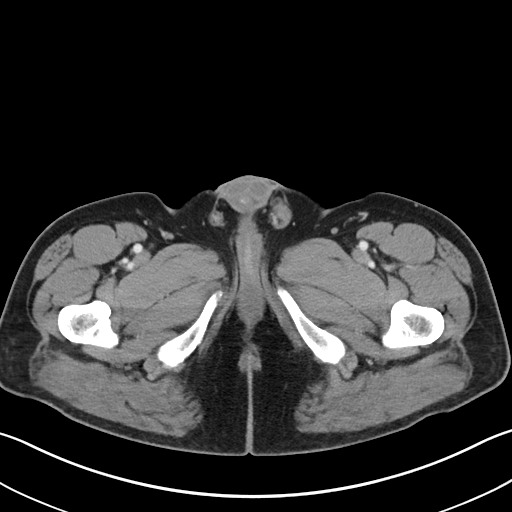
[im 5/97  bone]
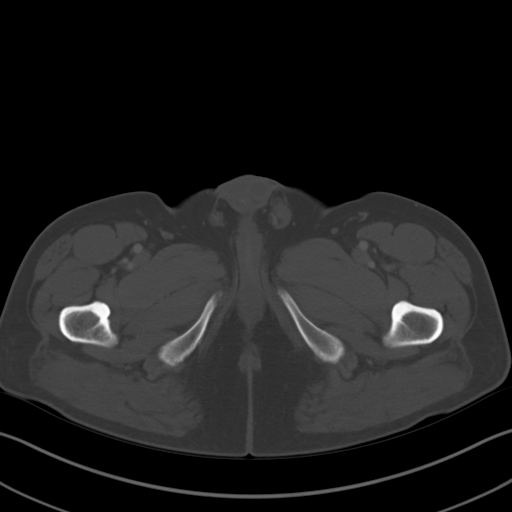
[im 15/97  soft-tissue]
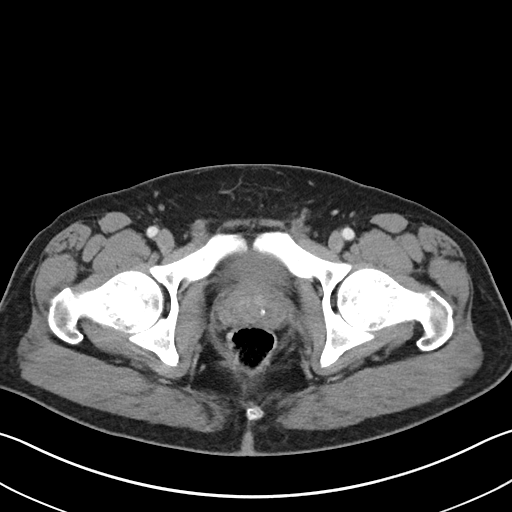
[im 20/97  soft-tissue]
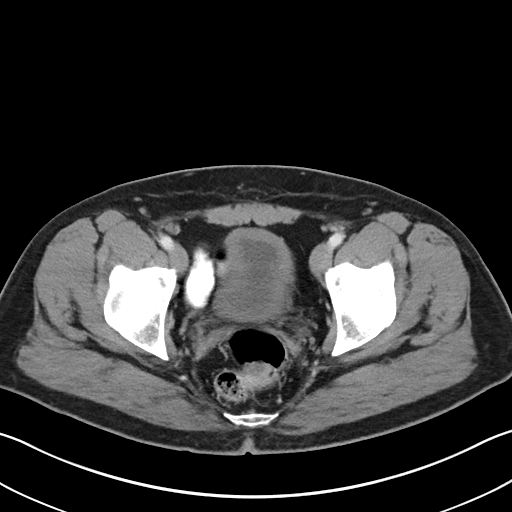
[im 29/97  soft-tissue]
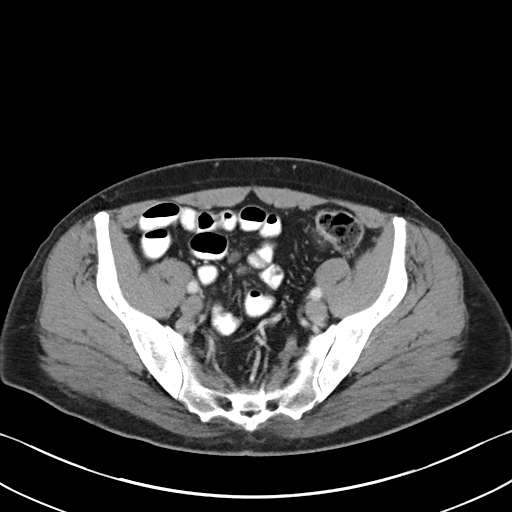
[im 39/97  soft-tissue]
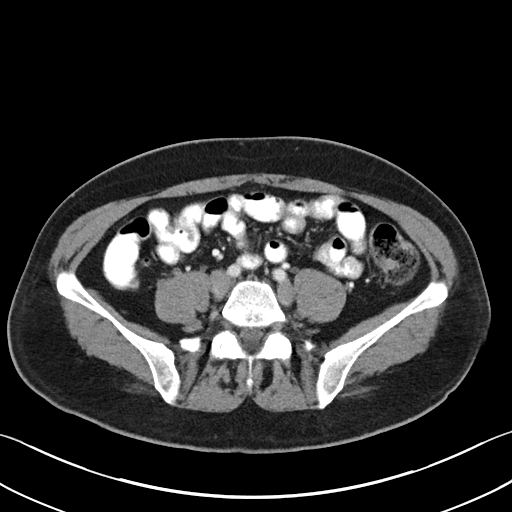
[im 44/97  soft-tissue]
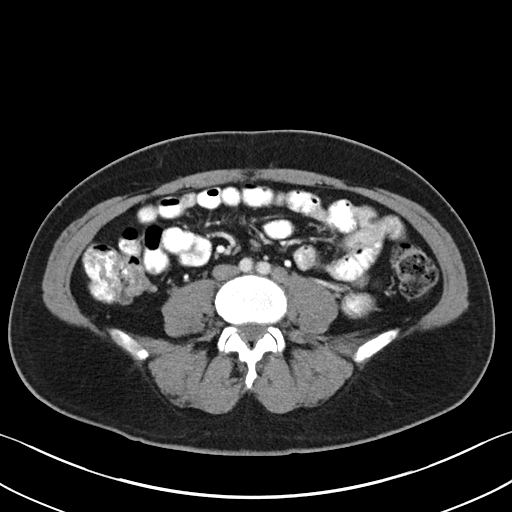
[im 53/97  soft-tissue]
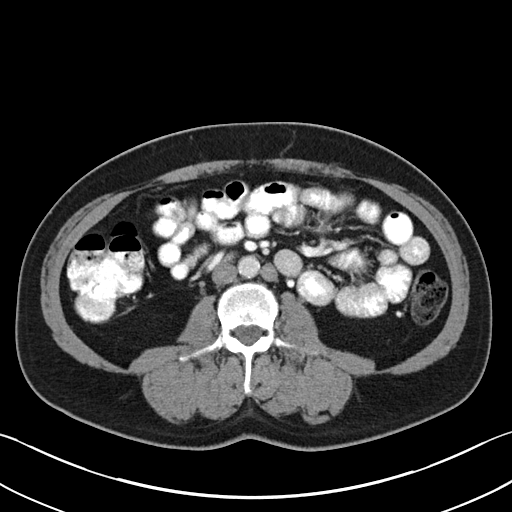
[im 58/97  soft-tissue]
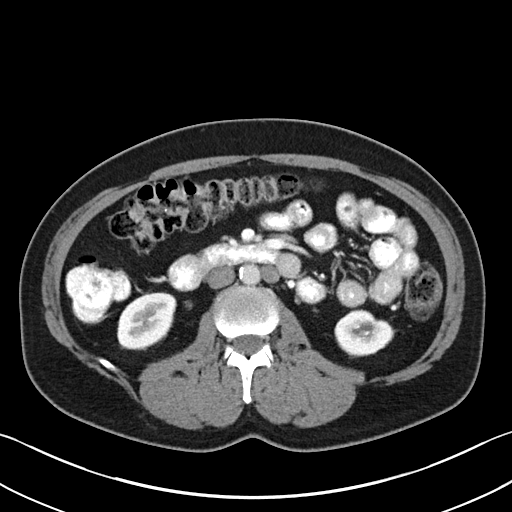
[im 68/97  soft-tissue]
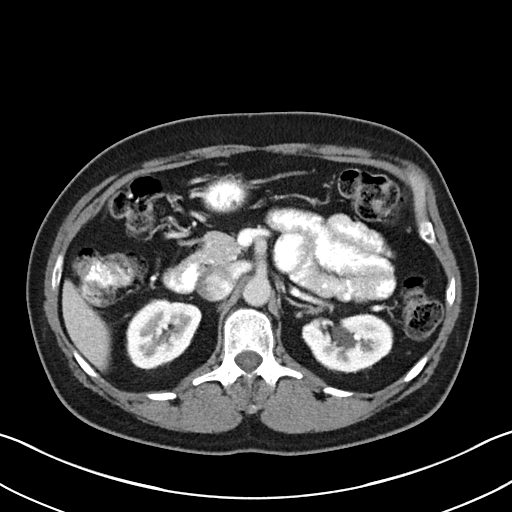
[im 68/97  bone]
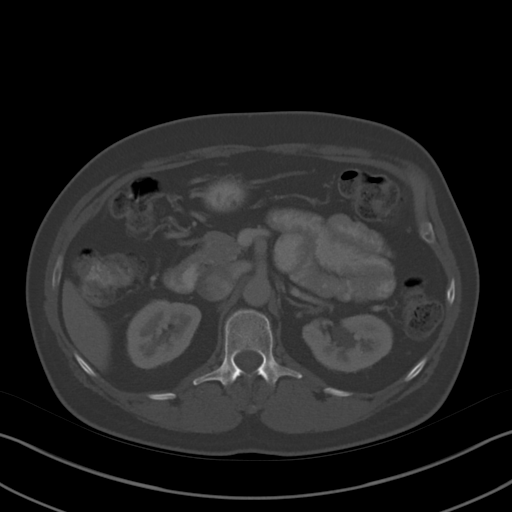
[im 77/97  soft-tissue]
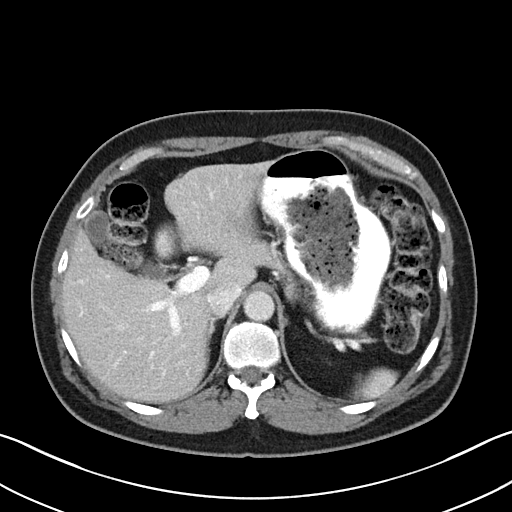
[im 82/97  soft-tissue]
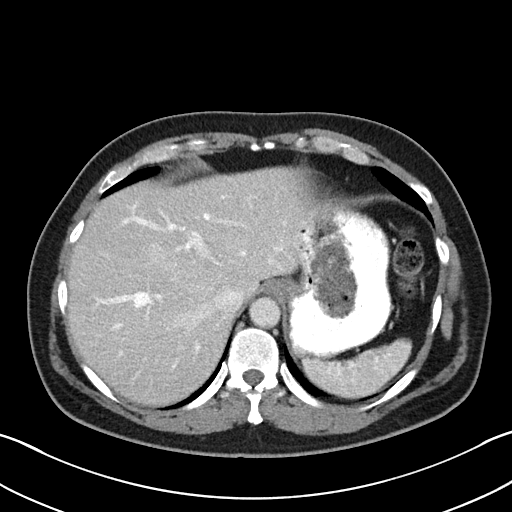
[im 92/97  soft-tissue]
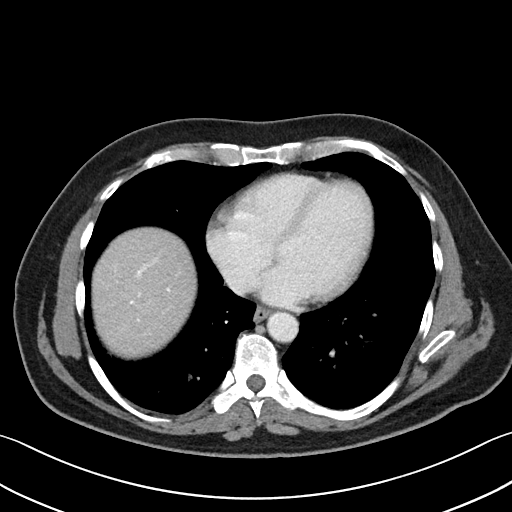

[Series 6: abd/pel w st · coronal · 0.65mm/px · 3 of 79 slices shown]
[im 27/79  soft-tissue]
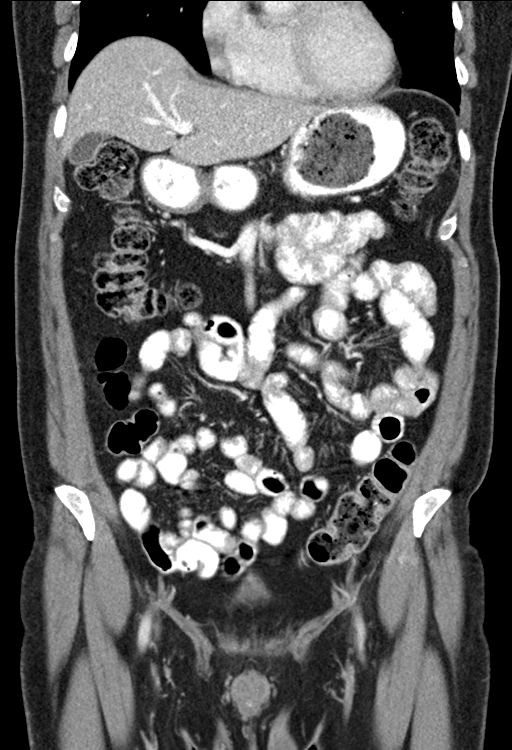
[im 35/79  soft-tissue]
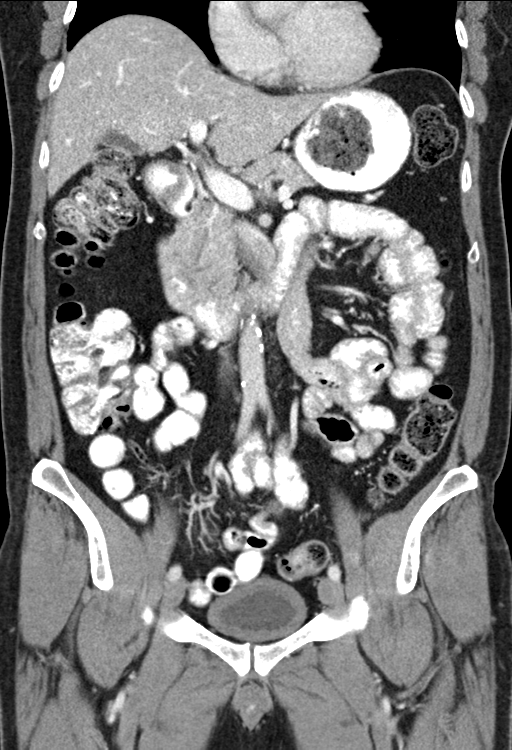
[im 44/79  soft-tissue]
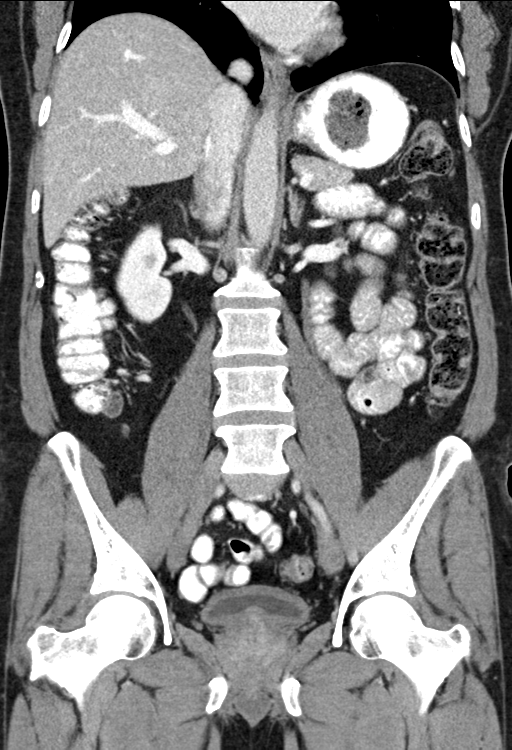

[15 of 46 positions shown; findings below may reference images not displayed]

FINDINGS: Lower Chest: No acute findings.

Hepatobiliary: No hepatic masses identified. Gallbladder is
unremarkable.

Pancreas:  No mass or inflammatory changes.

Spleen: Within normal limits in size and appearance.

Adrenals/Urinary Tract: Small right renal cyst noted. No masses
identified. Mild left renal pelvicaliectasis is seen, however there
is no evidence of ureteral calculi or dilatation. Moderate diffuse
bladder wall thickening is demonstrated.

Stomach/Bowel: No evidence of obstruction, inflammatory process or
abnormal fluid collections. Normal appendix visualized.

Vascular/Lymphatic: No pathologically enlarged lymph nodes. No
abdominal aortic aneurysm. Aortic atherosclerosis. Congenital
duplication of IVC incidentally noted.

Reproductive: Mild median lobe hypertrophy of prostate which indents
the bladder base.

Other:  None.

Musculoskeletal:  No suspicious bone lesions identified.
IMPRESSION: 1. Moderate diffuse bladder wall thickening, which may be due to
chronic bladder outlet obstruction or cystitis.
2. Mild left renal pelvicaliectasis, without evidence of ureteral
calculi or dilatation. Differential considerations include low-grade
congenital UPJ obstruction, previous obstruction, and vesicoureteral
reflux.
3. Mild prostatic median lobe hypertrophy.

Aortic Atherosclerosis ([id number].T).

## 2020-08-28 DIAGNOSIS — Z23 Encounter for immunization: Secondary | ICD-10-CM | POA: Diagnosis not present

## 2020-09-07 ENCOUNTER — Other Ambulatory Visit: Payer: Self-pay | Admitting: Internal Medicine

## 2020-09-07 DIAGNOSIS — Z8249 Family history of ischemic heart disease and other diseases of the circulatory system: Secondary | ICD-10-CM

## 2020-09-07 DIAGNOSIS — Z0000 Encounter for general adult medical examination without abnormal findings: Secondary | ICD-10-CM | POA: Diagnosis not present

## 2020-09-07 DIAGNOSIS — E1169 Type 2 diabetes mellitus with other specified complication: Secondary | ICD-10-CM | POA: Diagnosis not present

## 2020-09-07 DIAGNOSIS — E785 Hyperlipidemia, unspecified: Secondary | ICD-10-CM | POA: Diagnosis not present

## 2020-09-07 DIAGNOSIS — Z125 Encounter for screening for malignant neoplasm of prostate: Secondary | ICD-10-CM | POA: Diagnosis not present

## 2020-09-21 ENCOUNTER — Ambulatory Visit
Admission: RE | Admit: 2020-09-21 | Discharge: 2020-09-21 | Disposition: A | Discharge status: Home / Self Care | Payer: No Typology Code available for payment source | Source: Ambulatory Visit | Attending: Internal Medicine | Admitting: Internal Medicine

## 2020-09-21 DIAGNOSIS — Z8249 Family history of ischemic heart disease and other diseases of the circulatory system: Secondary | ICD-10-CM

## 2021-02-22 ENCOUNTER — Encounter (HOSPITAL_BASED_OUTPATIENT_CLINIC_OR_DEPARTMENT_OTHER): Payer: Self-pay

## 2021-02-22 ENCOUNTER — Other Ambulatory Visit: Payer: Self-pay

## 2021-02-22 ENCOUNTER — Emergency Department (HOSPITAL_BASED_OUTPATIENT_CLINIC_OR_DEPARTMENT_OTHER): Payer: BC Managed Care – PPO

## 2021-02-22 ENCOUNTER — Observation Stay (HOSPITAL_BASED_OUTPATIENT_CLINIC_OR_DEPARTMENT_OTHER)
Admission: EM | Admit: 2021-02-22 | Discharge: 2021-02-23 | Disposition: A | Discharge status: Home / Self Care | Payer: BC Managed Care – PPO | Attending: Cardiovascular Disease | Admitting: Cardiovascular Disease

## 2021-02-22 DIAGNOSIS — I10 Essential (primary) hypertension: Secondary | ICD-10-CM | POA: Insufficient documentation

## 2021-02-22 DIAGNOSIS — I480 Paroxysmal atrial fibrillation: Secondary | ICD-10-CM | POA: Diagnosis present

## 2021-02-22 DIAGNOSIS — E119 Type 2 diabetes mellitus without complications: Secondary | ICD-10-CM | POA: Diagnosis not present

## 2021-02-22 DIAGNOSIS — I4891 Unspecified atrial fibrillation: Principal | ICD-10-CM | POA: Insufficient documentation

## 2021-02-22 DIAGNOSIS — Z79899 Other long term (current) drug therapy: Secondary | ICD-10-CM | POA: Insufficient documentation

## 2021-02-22 DIAGNOSIS — Z20822 Contact with and (suspected) exposure to covid-19: Secondary | ICD-10-CM | POA: Diagnosis not present

## 2021-02-22 DIAGNOSIS — I447 Left bundle-branch block, unspecified: Secondary | ICD-10-CM | POA: Diagnosis not present

## 2021-02-22 DIAGNOSIS — E785 Hyperlipidemia, unspecified: Secondary | ICD-10-CM | POA: Diagnosis not present

## 2021-02-22 DIAGNOSIS — Z794 Long term (current) use of insulin: Secondary | ICD-10-CM | POA: Insufficient documentation

## 2021-02-22 HISTORY — DX: Paroxysmal atrial fibrillation: I48.0

## 2021-02-22 LAB — CBC WITH DIFFERENTIAL/PLATELET
Abs Immature Granulocytes: 0.01 10*3/uL (ref 0.00–0.07)
Basophils Absolute: 0 10*3/uL (ref 0.0–0.1)
Basophils Relative: 1 %
Eosinophils Absolute: 0.2 10*3/uL (ref 0.0–0.5)
Eosinophils Relative: 4 %
HCT: 50.7 % (ref 39.0–52.0)
Hemoglobin: 16.2 g/dL (ref 13.0–17.0)
Immature Granulocytes: 0 %
Lymphocytes Relative: 29 %
Lymphs Abs: 1.9 10*3/uL (ref 0.7–4.0)
MCH: 25.5 pg — ABNORMAL LOW (ref 26.0–34.0)
MCHC: 32 g/dL (ref 30.0–36.0)
MCV: 79.8 fL — ABNORMAL LOW (ref 80.0–100.0)
Monocytes Absolute: 0.7 10*3/uL (ref 0.1–1.0)
Monocytes Relative: 11 %
Neutro Abs: 3.6 10*3/uL (ref 1.7–7.7)
Neutrophils Relative %: 55 %
Platelets: 211 10*3/uL (ref 150–400)
RBC: 6.35 MIL/uL — ABNORMAL HIGH (ref 4.22–5.81)
RDW: 16.1 % — ABNORMAL HIGH (ref 11.5–15.5)
WBC: 6.4 10*3/uL (ref 4.0–10.5)
nRBC: 0 % (ref 0.0–0.2)

## 2021-02-22 LAB — BASIC METABOLIC PANEL
Anion gap: 6 (ref 5–15)
BUN: 21 mg/dL — ABNORMAL HIGH (ref 6–20)
CO2: 29 mmol/L (ref 22–32)
Calcium: 9.2 mg/dL (ref 8.9–10.3)
Chloride: 101 mmol/L (ref 98–111)
Creatinine, Ser: 1.24 mg/dL (ref 0.61–1.24)
GFR, Estimated: >60 mL/min (ref >60)
Glucose, Bld: 134 mg/dL — ABNORMAL HIGH (ref 70–99)
Potassium: 4.1 mmol/L (ref 3.5–5.1)
Sodium: 136 mmol/L (ref 135–145)

## 2021-02-22 LAB — PROTIME-INR
INR: 1.2 (ref 0.8–1.2)
Prothrombin Time: 15 seconds (ref 11.4–15.2)

## 2021-02-22 LAB — RESP PANEL BY RT-PCR (FLU A&B, COVID) ARPGX2
Influenza A by PCR: NEGATIVE
Influenza B by PCR: NEGATIVE
SARS Coronavirus 2 by RT PCR: NEGATIVE

## 2021-02-22 LAB — TROPONIN I (HIGH SENSITIVITY)
Troponin I (High Sensitivity): 9 ng/L (ref <18)
Troponin I (High Sensitivity): 12 ng/L (ref <18)

## 2021-02-22 LAB — MAGNESIUM: Magnesium: 2 mg/dL (ref 1.7–2.4)

## 2021-02-22 LAB — TSH: TSH: 2.182 u[IU]/mL (ref 0.350–4.500)

## 2021-02-22 MED ORDER — SODIUM CHLORIDE 0.9 % IV SOLN: INTRAVENOUS | Status: DC

## 2021-02-22 MED ORDER — INSULIN ASPART 100 UNIT/ML ~~LOC~~ SOLN
0.0000 [IU] | Freq: Three times a day (TID) | SUBCUTANEOUS | Status: DC
  Administered 2021-02-23: 2 [IU] via SUBCUTANEOUS

## 2021-02-22 MED ORDER — APIXABAN 5 MG PO TABS: 5.0000 mg | ORAL_TABLET | Freq: Two times a day (BID) | ORAL | Status: DC

## 2021-02-22 MED ORDER — ROSUVASTATIN CALCIUM 5 MG PO TABS
5.0000 mg | ORAL_TABLET | Freq: Every day | ORAL | Status: DC
  Administered 2021-02-23: 5 mg via ORAL
  Filled 2021-02-22: qty 1

## 2021-02-22 MED ORDER — ONDANSETRON HCL 4 MG/2ML IJ SOLN: 4.0000 mg | Freq: Four times a day (QID) | INTRAMUSCULAR | Status: DC | PRN

## 2021-02-22 MED ORDER — ACETAMINOPHEN 325 MG PO TABS: 650.0000 mg | ORAL_TABLET | ORAL | Status: DC | PRN

## 2021-02-22 MED ORDER — SODIUM CHLORIDE 0.9 % IV BOLUS
1000.0000 mL | Freq: Once | INTRAVENOUS | Status: AC
  Administered 2021-02-22: 1000 mL via INTRAVENOUS

## 2021-02-22 MED ORDER — PANTOPRAZOLE SODIUM 40 MG PO TBEC
40.0000 mg | DELAYED_RELEASE_TABLET | Freq: Every day | ORAL | Status: DC
  Administered 2021-02-23: 40 mg via ORAL
  Filled 2021-02-22: qty 1

## 2021-02-22 MED ORDER — LISINOPRIL 10 MG PO TABS
10.0000 mg | ORAL_TABLET | Freq: Every day | ORAL | Status: DC
  Filled 2021-02-22: qty 1

## 2021-02-22 MED ORDER — APIXABAN 5 MG PO TABS
5.0000 mg | ORAL_TABLET | Freq: Two times a day (BID) | ORAL | Status: DC
  Administered 2021-02-22 – 2021-02-23 (×2): 5 mg via ORAL
  Filled 2021-02-22 (×2): qty 1

## 2021-02-22 MED ORDER — NITROGLYCERIN 0.4 MG SL SUBL: 0.4000 mg | SUBLINGUAL_TABLET | SUBLINGUAL | Status: DC | PRN

## 2021-02-22 NOTE — ED Triage Notes (Signed)
Pt reports new onset Afib-NAD-steady gait

## 2021-02-22 NOTE — Plan of Care (Signed)
°  Problem: Education: °Goal: Knowledge of disease or condition will improve °Outcome: Progressing °Goal: Understanding of medication regimen will improve °Outcome: Progressing °  °

## 2021-02-22 NOTE — ED Provider Notes (Signed)
[facility] EMERGENCY DEPARTMENT Provider Note   CSN: [id number] Arrival date & time: 02/22/21  1315     History Chief Complaint  Patient presents with  . 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  HPI 60 year old male presents from the cardiology office with new onset A. fib.  The patient started feeling poorly this morning after awakening.  He feels an abnormal sensation in his chest but no chest pain or shortness of breath.  He does feel lightheaded.  He is currently fasting so he broke the fast this morning with some Gatorade but it did not really help.  His apple watch took an EKG that showed A. fib and they went to the office and found an EKG that showed A. fib as well.  He states his apple watch has sporadically told him he has A. fib about twice in the last year but he never had any symptoms so he thought it was not correct.   Past Medical History:  Diagnosis Date  . Diabetes ([place])   . Dyslipidemia   . H. pylori infection    Treated in 2009 or 2010  . Hypertension   . Prostatitis     Patient Active Problem List   Diagnosis Date Noted  . New onset atrial fibrillation ([place]) 02/22/2021  . Travel advice encounter 12/15/2018    Past Surgical History:  Procedure Laterality Date  . CATARACT EXTRACTION, BILATERAL    . COLONOSCOPY     around 2009 or 2010. Was normal. [place]  . ESOPHAGOGASTRODUODENOSCOPY     Around 2009 or 2010. [place] Treated for H pylori  . NOSE SURGERY         Family History  Problem Relation Age of Onset  . Hypertension Mother   . CAD Father   . Diabetes Brother        x 2  . CAD Brother   . Breast cancer Cousin   . Colon cancer Neg Hx   . Esophageal cancer Neg Hx     Social History   Tobacco Use  . Smoking status: Never Smoker  . Smokeless tobacco: Never Used  Vaping Use  . Vaping Use: Never used  Substance Use Topics  . Alcohol use: Never  . Drug use: Never    Home Medications Prior to  Admission medications   Medication Sig Start Date End Date Taking? Authorizing Provider  insulin aspart (NOVOLOG) 100 UNIT/ML injection Inject 5 Units into the skin 2 (two) times daily.     [provider]  lisinopril (PRINIVIL,ZESTRIL) 10 MG tablet Take 10 mg by mouth daily.    [provider]  metformin (FORTAMET) 500 MG (OSM) 24 hr tablet Take 1,000 mg by mouth 2 (two) times daily.     [provider]  Na Sulfate-K Sulfate-Mg Sulf 17.5-3.13-1.6 GM/177ML SOLN Suprep-Use as directed 05/13/19   Willia Craze, NP  pantoprazole (PROTONIX) 40 MG tablet Take 1 tablet (40 mg total) by mouth daily. Take one tablet 1/2 hour before breakfast 07/16/19   Jackquline Denmark, MD  rosuvastatin (CRESTOR) 5 MG tablet Take 5 mg by mouth daily.    [provider]  Semaglutide (OZEMPIC [place]) Inject 0.25 mg into the skin once a week.     [provider]    Allergies    Patient has no known allergies.  Review of Systems   Review of Systems  Constitutional: Positive for fatigue.  Respiratory: Negative for shortness of  breath.   Cardiovascular: Negative for chest pain and palpitations.  Neurological: Positive for light-headedness.  All other systems reviewed and are negative.   Physical Exam Updated Vital Signs BP 120/84   Pulse 80   Temp 97.9 F (36.6 C) (Oral)   Resp (!) 21   Ht 5\' 7"  (1.702 m)   Wt 73.9 kg   SpO2 100%   BMI 25.53 kg/m   Physical Exam Vitals and nursing note reviewed.  Constitutional:      General: He is not in acute distress.    Appearance: He is well-developed. He is not ill-appearing or diaphoretic.  HENT:     Head: Normocephalic and atraumatic.     Right Ear: External ear normal.     Left Ear: External ear normal.     Nose: Nose normal.  Eyes:     General:        Right eye: No discharge.        Left eye: No discharge.  Cardiovascular:     Rate and Rhythm: Normal rate. Rhythm irregular.     Pulses:          Radial pulses are  2+ on the right side and 2+ on the left side.     Heart sounds: Normal heart sounds.  Pulmonary:     Effort: Pulmonary effort is normal.     Breath sounds: Normal breath sounds.  Abdominal:     Palpations: Abdomen is soft.     Tenderness: There is no abdominal tenderness.  Musculoskeletal:     Cervical back: Neck supple.  Skin:    General: Skin is warm and dry.  Neurological:     Mental Status: He is alert.  Psychiatric:        Mood and Affect: Mood is not anxious.     ED Results / Procedures / Treatments   Labs (all labs ordered are listed, but only abnormal results are displayed) Labs Reviewed  BASIC METABOLIC PANEL - Abnormal; Notable for the following components:      Result Value   Glucose, Bld 134 (*)    BUN 21 (*)    All other components within normal limits  CBC WITH DIFFERENTIAL/PLATELET - Abnormal; Notable for the following components:   RBC 6.35 (*)    MCV 79.8 (*)    MCH 25.5 (*)    RDW 16.1 (*)    All other components within normal limits  RESP PANEL BY RT-PCR (FLU A&B, COVID) ARPGX2  MAGNESIUM  TSH  TROPONIN I (HIGH SENSITIVITY)    EKG EKG Interpretation  Date/Time:  Thursday February 22 2021 13:26:33 EDT Ventricular Rate:  91 PR Interval:    QRS Duration: 156 QT Interval:  404 QTC Calculation: 496 R Axis:   -2 Text Interpretation: Atrial fibrillation Left bundle branch block Abnormal ECG No old tracing to compare Confirmed by Sherwood Gambler [phone]) on 02/22/2021 1:51:59 PM   Radiology DG Chest Portable 1 View  Result Date: 02/22/2021 CLINICAL DATA:  Atrial fibrillation. EXAM: PORTABLE CHEST 1 VIEW COMPARISON:  None. FINDINGS: The heart size and mediastinal contours are within normal limits. Both lungs are clear. The visualized skeletal structures are unremarkable. IMPRESSION: No active disease. Electronically Signed   By: Marijo Conception M.D.   On: 02/22/2021 14:22    Procedures Procedures   Medications Ordered in ED Medications  sodium  chloride 0.9 % bolus 1,000 mL (1,000 mLs Intravenous New Bag/Given 02/22/21 1401)    ED Course  I have reviewed  the triage vital signs and the nursing notes.  Pertinent labs & imaging results that were available during my care of the patient were reviewed by me and considered in my medical decision making (see chart for details).    MDM Rules/Calculators/A&P                          Patient presents with new onset atrial fibrillation from his cardiology office.  Sounds like it probably started this morning given how symptomatic he is.  However because of his left bundle branch block, his cardiologist Dr. Agustin Cree is hesitant about cardioverting prior to an echo, which unfortunately we do not have available at this freestanding ED.  Thus, he will need to be transferred and admitted to [facility].  He did take a dose of 5 mg Eliquis this morning.  We will continue this.  Discussed with Dr. Acie Fredrickson who will admit.  He is rate controlled.  No anginal symptoms. Final Clinical Impression(s) / ED Diagnoses Final diagnoses:  New onset atrial fibrillation [facility])    Rx / DC Orders ED Discharge Orders    None       Sherwood Gambler, MD 02/22/21 1514

## 2021-02-22 NOTE — Anesthesia Preprocedure Evaluation (Addendum)
Anesthesia Evaluation  Patient identified by MRN, date of birth, ID band Patient awake    Reviewed: Allergy & Precautions, NPO status , Patient's Chart, lab work & pertinent test results  Airway Mallampati: II  TM Distance: >3 FB Neck ROM: Full    Dental no notable dental hx. (+) Teeth Intact, Dental Advisory Given   Pulmonary neg pulmonary ROS,    Pulmonary exam normal breath sounds clear to auscultation       Cardiovascular hypertension, Pt. on medications Normal cardiovascular exam+ dysrhythmias Atrial Fibrillation  Rhythm:Irregular Rate:Abnormal     Neuro/Psych negative neurological ROS  negative psych ROS   GI/Hepatic Neg liver ROS, GERD  ,  Endo/Other  diabetes, Type 2, Oral Hypoglycemic Agents  Renal/GU negative Renal ROS     Musculoskeletal   Abdominal   Peds  Hematology   Anesthesia Other Findings   Reproductive/Obstetrics                            Anesthesia Physical Anesthesia Plan  ASA: III  Anesthesia Plan: MAC   Post-op Pain Management:    Induction: Intravenous  PONV Risk Score and Plan: Treatment may vary due to age or medical condition  Airway Management Planned: Natural Airway  Additional Equipment: None  Intra-op Plan:   Post-operative Plan:   Informed Consent: I have reviewed the patients History and Physical, chart, labs and discussed the procedure including the risks, benefits and alternatives for the proposed anesthesia with the patient or authorized representative who has indicated his/her understanding and acceptance.     Dental advisory given  Plan Discussed with: CRNA and Anesthesiologist  Anesthesia Plan Comments:        Anesthesia Quick Evaluation

## 2021-02-22 NOTE — H&P (Addendum)
Cardiology Admission History and Physical:   Patient ID: Cristian Brothers, [name] MRN: [id number]; DOB: 29-Dec-1960   Admission date: 02/22/2021  PCP:  [name], [name]   [place]  Cardiologist:  Dr. [name]  Chief Complaint:  AFib  Patient Profile:   Cristian Brothers, [name] is a 60 y.o. male with history of diabetes mellitus, hypertension and strong family history of CAD transferred from [place] for atrial fibrillation at controlled ventricular rate.  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  He works at [employer] and has his own practice in [place], [place].  Patient has strong family history of CAD with 2 elder brother having CABG in the 20s.  Father had CABG in his 23s as well.  Patient had stress echocardiogram by Dr. [name] twice which resulted normal.  He plays tennis 3 days/week without any limitation.  CT cardiac scoring 09/2020 showed total coronary calcium score of 0.  Patient suddenly felt dizzy, fatigue and abnormal sensation in his chest while at work at [employer].  His apple watch showed atrial fibrillation on EKG.  He went to his office in [place] where the EKG showed atrial fibrillation and new onset left bundle blanch block.  His heart rate was controlled in 70s.  He called Dr. [name] who recommended who recommended to go to [place].  He took Eliquis 5 mg prior to presentation.  He was given 1 L of fluid.  Electrolytes and hemoglobin normal.  He is transferred to [facility] for further work-up.  He denies chest pain, shortness of breath, orthopnea, PND, syncope, lower extremity edema or melena.  Past Medical History:  Diagnosis Date  . Diabetes ([place])   . Dyslipidemia   . H. pylori infection    Treated in 2009 or 2010  . Hypertension   . Prostatitis     Past Surgical History:  Procedure Laterality Date  . CATARACT EXTRACTION, BILATERAL    . COLONOSCOPY     around 2009 or 2010. Was  normal. [place]  . ESOPHAGOGASTRODUODENOSCOPY     Around 2009 or 2010. [place] Treated for H pylori  . NOSE SURGERY       Medications Prior to Admission: Prior to Admission medications   Medication Sig Start Date End Date Taking? Authorizing Provider  insulin aspart (NOVOLOG) 100 UNIT/ML injection Inject 5 Units into the skin 2 (two) times daily.     Provider, Historical, [name]  lisinopril (PRINIVIL,ZESTRIL) 10 MG tablet Take 10 mg by mouth daily.    Provider, Historical, [name]  metformin (FORTAMET) 500 MG (OSM) 24 hr tablet Take 1,000 mg by mouth 2 (two) times daily.     Provider, Historical, [name]  Na Sulfate-K Sulfate-Mg Sulf 17.5-3.13-1.6 GM/177ML SOLN Suprep-Use as directed 05/13/19   Willia Craze, NP  pantoprazole (PROTONIX) 40 MG tablet Take 1 tablet (40 mg total) by mouth daily. Take one tablet 1/2 hour before breakfast 07/16/19   Jackquline Denmark, [name]  rosuvastatin (CRESTOR) 5 MG tablet Take 5 mg by mouth daily.    Provider, Historical, [name]  Semaglutide (OZEMPIC [place]) Inject 0.25 mg into the skin once a week.     Provider, Historical, [name]     Allergies:   No Known Allergies  Social History:   Social History   Socioeconomic History  . Marital status: Married    Spouse name: Not on file  . Number of children: 4  . Years of  education: Not on file  . Highest education level: Not on file  Occupational History  . Occupation: Physician  Tobacco Use  . Smoking status: Never Smoker  . Smokeless tobacco: Never Used  Vaping Use  . Vaping Use: Never used  Substance and Sexual Activity  . Alcohol use: Never  . Drug use: Never  . Sexual activity: Not on file  Other Topics Concern  . Not on file  Social History Narrative  . Not on file   Social Determinants of Health   Financial Resource Strain: Not on file  Food Insecurity: Not on file  Transportation Needs: Not on file  Physical Activity: Not on file  Stress: Not on file  Social Connections: Not on file  Intimate  Partner Violence: Not on file    Family History:  The patient's family history includes Breast cancer in his cousin; CAD in his brother and father; Diabetes in his brother; Hypertension in his mother. There is no history of Colon cancer or Esophageal cancer.    ROS:  Please see the history of present illness.  All other ROS reviewed and negative.     Physical Exam/Data:   Vitals:   02/22/21 1400 02/22/21 1415 02/22/21 1645 02/22/21 1802  BP: 108/82 120/84 115/84 136/84  Pulse: 70 80 73 74  Resp: 14 (!) 21 14 20   Temp:    97.8 F (36.6 C)  TempSrc:    Oral  SpO2: 100% 100% 100% 100%  Weight:      Height:        Intake/Output Summary (Last 24 hours) at 02/22/2021 1841 Last data filed at 02/22/2021 1622 Gross per 24 hour  Intake 900.67 ml  Output --  Net 900.67 ml   Last 3 Weights 02/22/2021 05/25/2019 05/13/2019  Weight (lbs) 163 lb 161 lb 161 lb  Weight (kg) 73.936 kg 73.029 kg 73.029 kg     Body mass index is 25.53 kg/m.  General:  Well nourished, well developed, in no acute distress HEENT: normal Lymph: no adenopathy Neck: no  JVD Endocrine:  No thryomegaly Vascular: No carotid bruits; FA pulses 2+ bilaterally without bruits  Cardiac:  normal S1, S2; RRR; no murmur  Lungs:  clear to auscultation bilaterally, no wheezing, rhonchi or rales  Abd: soft, nontender, no hepatomegaly  Ext: no  edema Musculoskeletal:  No deformities, BUE and BLE strength normal and equal Skin: warm and dry  Neuro:  CNs 2-12 intact, no focal abnormalities noted Psych:  Normal affect    EKG:  The ECG that was done  was personally reviewed and demonstrates atrial fibrillation and left bundle branch block  Relevant CV Studies: Pending TEE cardioversion  Laboratory Data:  High Sensitivity Troponin:   Recent Labs  Lab 02/22/21 1345  TROPONINIHS 9      Chemistry Recent Labs  Lab 02/22/21 1345  NA 136  K 4.1  CL 101  CO2 29  GLUCOSE 134*  BUN 21*  CREATININE 1.24  CALCIUM 9.2   GFRNONAA >60  ANIONGAP 6    Hematology Recent Labs  Lab 02/22/21 1345  WBC 6.4  RBC 6.35*  HGB 16.2  HCT 50.7  MCV 79.8*  MCH 25.5*  MCHC 32.0  RDW 16.1*  PLT 211    Radiology/Studies:  DG Chest Portable 1 View  Result Date: 02/22/2021 CLINICAL DATA:  Atrial fibrillation. EXAM: PORTABLE CHEST 1 VIEW COMPARISON:  None. FINDINGS: The heart size and mediastinal contours are within normal limits. Both lungs are clear. The visualized skeletal  structures are unremarkable. IMPRESSION: No active disease. Electronically Signed   By: Marijo Conception M.D.   On: 02/22/2021 14:22     Assessment and Plan:   1. New onset atrial fibrillation -Patient previously had to modification of atrial fibrillation by Apple Watch however he never paid any attention.  This morning he was symptomatic with rate control atrial fibrillation.  He took Eliquis 5 mg prior to presentation.  -Pending TSH -Electrolyte normal -Plan for TEE cardioversion tomorrow -Start Eliquis 5 mg twice daily -Likely need as needed rate control agent  2.  New onset left bundle branch block 3.  Strong family history of CAD -Patient with new onset left bundle branch block.  He is very active at baseline playing tennis 3 times per week without chest pain or shortness of breath.  Coronary calcium score of 0 in November 2021. -Prior normal stress echocardiogram by Dr. [name] (no records available for review) -Likely outpatient work-up  4.  Hypertension -Continue home antihypertensive  5.  Diabetes mellitus -Sliding scale insulin while admitted   Risk Assessment/Risk Scores:    CHA2DS2-VASc Score = 2 This indicates a 2.2% annual risk of stroke. The patient's score is based upon: CHF History: No HTN History: Yes Diabetes History: Yes Stroke History: No Vascular Disease History: No Age Score: 0 Gender Score: 0    Severity of Illness: The appropriate patient status for this patient is OBSERVATION. Observation status  is judged to be reasonable and necessary in order to provide the required intensity of service to ensure the patient's safety. The patient's presenting symptoms, physical exam findings, and initial radiographic and laboratory data in the context of their medical condition is felt to place them at decreased risk for further clinical deterioration. Furthermore, it is anticipated that the patient will be medically stable for discharge from the hospital within 2 midnights of admission. The following factors support the patient status of observation.   " The patient's presenting symptoms include fluttering sensation and dizziness. " The physical exam findings include none " The initial radiographic and laboratory data are atrial fibrillation     For questions or updates, please contact [place] Please consult [email or web address] for contact info under     Jarrett Soho, [place]  02/22/2021 6:41 PM   Attending Note:   The patient was seen and examined.  Agree with assessment and plan as noted above.  Changes made to the above note as needed.  Patient seen and independently examined with Robbie Lis, PA .   We discussed all aspects of the encounter. I agree with the assessment and plan as stated above.  1.   Atrial fib:   HR is well controlled.   He is symptomatic - feels an uneasiness in his chest .  He has been started on eliquis this am .  We have scheduled him for a TEE / CV tomorrow . We have discussed the risks, benefits, options.  He understands and agrees to proceed.   2.   LBBB:  He has a new diagnosis of LBBB .   Is remarkably healthy.   Plays tennis 3 times a week.  He has a strong family history of CAD     He has never had any cp.  No dyspnea He had a coronary calcium score of 0 in Nov. 2021. Given his new LBBB, I would like to do a more complete work up with a coronary CT angiogram  Will get the coronary CT angiogram following the  TEE cardioversion    I have spent a  total of 40 minutes with patient reviewing hospital  notes , telemetry, EKGs, labs and examining patient as well as establishing an assessment and plan that was discussed with the patient. > 50% of time was spent in direct patient care.    Thayer Headings, Brooke Bonito., [name], [facility] 02/22/2021, 6:50 PM [address],  [place] Pager [phone]

## 2021-02-22 NOTE — ED Notes (Signed)
Pt stated he has no pain or pressure in his chest. He feels different or off, but no pain. Says he is overall active and healthy. States he started feeling different around 8am this morning.

## 2021-02-23 ENCOUNTER — Observation Stay (HOSPITAL_BASED_OUTPATIENT_CLINIC_OR_DEPARTMENT_OTHER): Payer: BC Managed Care – PPO

## 2021-02-23 ENCOUNTER — Observation Stay (HOSPITAL_COMMUNITY): Payer: BC Managed Care – PPO | Admitting: Anesthesiology

## 2021-02-23 ENCOUNTER — Observation Stay (HOSPITAL_COMMUNITY): Payer: BC Managed Care – PPO

## 2021-02-23 ENCOUNTER — Other Ambulatory Visit (HOSPITAL_COMMUNITY): Payer: BC Managed Care – PPO

## 2021-02-23 ENCOUNTER — Encounter (HOSPITAL_COMMUNITY)
Admission: EM | Disposition: A | Discharge status: Home / Self Care | Payer: Self-pay | Source: Home / Self Care | Attending: Emergency Medicine

## 2021-02-23 ENCOUNTER — Encounter (HOSPITAL_COMMUNITY): Payer: Self-pay | Admitting: Cardiovascular Disease

## 2021-02-23 ENCOUNTER — Other Ambulatory Visit (HOSPITAL_COMMUNITY): Payer: Self-pay

## 2021-02-23 DIAGNOSIS — Z20822 Contact with and (suspected) exposure to covid-19: Secondary | ICD-10-CM | POA: Diagnosis not present

## 2021-02-23 DIAGNOSIS — I4891 Unspecified atrial fibrillation: Secondary | ICD-10-CM | POA: Diagnosis not present

## 2021-02-23 DIAGNOSIS — I081 Rheumatic disorders of both mitral and tricuspid valves: Secondary | ICD-10-CM | POA: Diagnosis not present

## 2021-02-23 DIAGNOSIS — I447 Left bundle-branch block, unspecified: Secondary | ICD-10-CM | POA: Diagnosis not present

## 2021-02-23 DIAGNOSIS — I10 Essential (primary) hypertension: Secondary | ICD-10-CM

## 2021-02-23 DIAGNOSIS — E119 Type 2 diabetes mellitus without complications: Secondary | ICD-10-CM | POA: Diagnosis not present

## 2021-02-23 HISTORY — PX: CARDIOVERSION: SHX1299

## 2021-02-23 HISTORY — PX: TEE WITHOUT CARDIOVERSION: SHX5443

## 2021-02-23 LAB — URINALYSIS, DIPSTICK ONLY
Bilirubin Urine: NEGATIVE
Glucose, UA: >=500 mg/dL — AB
Hgb urine dipstick: NEGATIVE
Ketones, ur: NEGATIVE mg/dL
Leukocytes,Ua: NEGATIVE
Nitrite: NEGATIVE
Protein, ur: NEGATIVE mg/dL
Specific Gravity, Urine: 1.023 (ref 1.005–1.030)
pH: 7 (ref 5.0–8.0)

## 2021-02-23 LAB — ECHOCARDIOGRAM COMPLETE
Height: 67 in
S' Lateral: 3.2 cm
Weight: 3418.01 oz

## 2021-02-23 LAB — GLUCOSE, CAPILLARY
Glucose-Capillary: 126 mg/dL — ABNORMAL HIGH (ref 70–99)
Glucose-Capillary: 94 mg/dL (ref 70–99)
Glucose-Capillary: 82 mg/dL (ref 70–99)
Glucose-Capillary: 180 mg/dL — ABNORMAL HIGH (ref 70–99)

## 2021-02-23 LAB — HEMOGLOBIN A1C
Hgb A1c MFr Bld: 7.4 % — ABNORMAL HIGH (ref 4.8–5.6)
Mean Plasma Glucose: 165.68 mg/dL

## 2021-02-23 LAB — HIV ANTIBODY (ROUTINE TESTING W REFLEX): HIV Screen 4th Generation wRfx: NONREACTIVE

## 2021-02-23 SURGERY — ECHOCARDIOGRAM, TRANSESOPHAGEAL
Anesthesia: Monitor Anesthesia Care

## 2021-02-23 MED ORDER — LIDOCAINE 2% (20 MG/ML) 5 ML SYRINGE
INTRAMUSCULAR | Status: DC | PRN
  Administered 2021-02-23: 60 mg via INTRAVENOUS

## 2021-02-23 MED ORDER — IOHEXOL 350 MG/ML SOLN
80.0000 mL | Freq: Once | INTRAVENOUS | Status: AC | PRN
  Administered 2021-02-23: 80 mL via INTRAVENOUS

## 2021-02-23 MED ORDER — IVABRADINE HCL 7.5 MG PO TABS
15.0000 mg | ORAL_TABLET | Freq: Once | ORAL | Status: DC
  Filled 2021-02-23: qty 2

## 2021-02-23 MED ORDER — SODIUM CHLORIDE 0.9 % IV SOLN: INTRAVENOUS | Status: DC | PRN

## 2021-02-23 MED ORDER — PHENYLEPHRINE 40 MCG/ML (10ML) SYRINGE FOR IV PUSH (FOR BLOOD PRESSURE SUPPORT)
PREFILLED_SYRINGE | INTRAVENOUS | Status: DC | PRN
  Administered 2021-02-23 (×2): 80 ug via INTRAVENOUS

## 2021-02-23 MED ORDER — IVABRADINE HCL 7.5 MG PO TABS: 15.0000 mg | ORAL_TABLET | Freq: Two times a day (BID) | ORAL | Status: DC

## 2021-02-23 MED ORDER — PROPOFOL 10 MG/ML IV BOLUS
INTRAVENOUS | Status: DC | PRN
  Administered 2021-02-23: 50 mg via INTRAVENOUS

## 2021-02-23 MED ORDER — PROPOFOL 500 MG/50ML IV EMUL
INTRAVENOUS | Status: DC | PRN
  Administered 2021-02-23: 100 ug/kg/min via INTRAVENOUS

## 2021-02-23 MED ORDER — NITROGLYCERIN 0.4 MG SL SUBL
SUBLINGUAL_TABLET | SUBLINGUAL | Status: AC
  Filled 2021-02-23: qty 2

## 2021-02-23 MED ORDER — APIXABAN 5 MG PO TABS
5.0000 mg | ORAL_TABLET | Freq: Two times a day (BID) | ORAL | 11 refills | Status: DC
  Filled 2021-02-23: qty 60, 30d supply, fill #0

## 2021-02-23 NOTE — CV Procedure (Signed)
   TRANSESOPHAGEAL ECHOCARDIOGRAM GUIDED DIRECT CURRENT CARDIOVERSION  NAME:  Markeith Jue, MD   MRN: [id number] DOB:  1960/12/23   ADMIT DATE: 02/22/2021  INDICATIONS: Symptomatic atrial fibrillation with RVR  PROCEDURE:   Informed consent was obtained prior to the procedure. The risks, benefits and alternatives for the procedure were discussed and the patient comprehended these risks.  Risks include, but are not limited to, cough, sore throat, vomiting, nausea, somnolence, esophageal and stomach trauma or perforation, bleeding, low blood pressure, aspiration, pneumonia, infection, trauma to the teeth and death.    After a procedural time-out, the patient was sedated by the anesthesia service. The transesophageal probe was inserted in the esophagus and stomach without difficulty and multiple views were obtained. Anesthesia was monitored by Dr. Valma Cava. Patient received 60 mg IV lidocaine and 253 mg IV propofol. He also received 160 mcg of phenylephrine for blood pressure support  COMPLICATIONS:    Complications: No complications Patient tolerated procedure well.  FINDINGS:  LEFT VENTRICLE: EF = 35-40% with regional wall motion abnormalities. These are most notable in the septum, see final report for full evaluation. Transthoracic echo performed once he was in sinus rhythm rather than atrial fibrillation with RVR.  RIGHT VENTRICLE: Normal size and function.   LEFT ATRIUM: No thrombus/mass.  LEFT ATRIAL APPENDAGE: No thrombus/mass.   RIGHT ATRIUM: No thrombus/mass.  AORTIC VALVE:  Trileaflet. No regurgitation. No vegetation.  MITRAL VALVE:    Normal structure. Trivial regurgitation. No vegetation.  TRICUSPID VALVE: Normal structure. Trivial regurgitation. No vegetation.  PULMONIC VALVE: Grossly normal structure. Trivial regurgitation. No apparent vegetation.  INTERATRIAL SEPTUM: No PFO or ASD seen by color Doppler.  PERICARDIUM: Small effusion noted.  DESCENDING AORTA: Mild  diffuse plaque seen   CARDIOVERSION:     Indications:  Symptomatic Atrial Fibrillation  Procedure Details:  Once the TEE was complete, the patient had the defibrillator pads placed in the anterior and posterior position. Once an appropriate level of sedation was confirmed, the patient was cardioverted x 1 with 120J of biphasic synchronized energy.  The patient converted to NSR.  There were no apparent complications.  The patient had normal neuro status and respiratory status post procedure with vitals stable as recorded elsewhere.  Adequate airway was maintained throughout and vital signs monitored per protocol.  Buford Dresser, MD, PhD [facility]  [address], [address] [place], [place] [address] [phone]   2:44 PM  Findings communicated with Rosaria Ferries

## 2021-02-23 NOTE — Anesthesia Postprocedure Evaluation (Signed)
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, MD  Procedure(s) Performed: TRANSESOPHAGEAL ECHOCARDIOGRAM (TEE) (N/A ) CARDIOVERSION (N/A )     Patient location during evaluation: Endoscopy Anesthesia Type: MAC Level of consciousness: awake and alert Pain management: pain level controlled Vital Signs Assessment: post-procedure vital signs reviewed and stable Respiratory status: spontaneous breathing, nonlabored ventilation, respiratory function stable and patient connected to nasal cannula oxygen Cardiovascular status: blood pressure returned to baseline and stable Postop Assessment: no apparent nausea or vomiting Anesthetic complications: no   No complications documented.  Last Vitals:  Vitals:   02/23/21 1514 02/23/21 1519  BP: 97/62 101/68  Pulse: 71 72  Resp: 14 15  Temp:    SpO2: 99% 100%    Last Pain:  Vitals:   02/23/21 1519  TempSrc:   PainSc: 0-No pain                 Barnet Glasgow

## 2021-02-23 NOTE — Interval H&P Note (Signed)
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  The various methods of treatment have been discussed with the patient and family. After consideration of risks, benefits and other options for treatment, the patient has consented to  Procedure(s): TRANSESOPHAGEAL ECHOCARDIOGRAM (TEE) (N/A) CARDIOVERSION (N/A) as a surgical intervention.  The patient's history has been reviewed, patient examined, no change in status, stable for surgery.  I have reviewed the patient's chart and labs.  Questions were answered to the patient's satisfaction.     Kamarri Fischetti Harrell Gave

## 2021-02-23 NOTE — Progress Notes (Signed)
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 apixaban  5 mg Oral BID  . insulin aspart  0-15 Units Subcutaneous TID WC  . lisinopril  10 mg Oral Daily  . pantoprazole  40 mg Oral Daily  . rosuvastatin  5 mg Oral Daily   Continuous Infusions: . sodium chloride 20 mL/hr at 02/23/21 0919   PRN Meds: acetaminophen, nitroGLYCERIN, ondansetron (ZOFRAN) IV   Vital Signs    Vitals:   02/22/21 2000 02/22/21 2327 02/23/21 0517 02/23/21 0728  BP:  (!) 83/66 92/60 101/69  Pulse:  72 61 76  Resp: 14 18 16 16   Temp:  98 F (36.7 C) 97.9 F (36.6 C) 98 F (36.7 C)  TempSrc:  Oral Oral Oral  SpO2:  98% 96% 99%  Weight:   96.9 kg   Height:        Intake/Output Summary (Last 24 hours) at 02/23/2021 1128 Last data filed at 02/22/2021 1622 Gross per 24 hour  Intake 900.67 ml  Output --  Net 900.67 ml   Last 3 Weights 02/23/2021 02/22/2021 05/25/2019  Weight (lbs) 213 lb 10 oz 163 lb 161 lb  Weight (kg) 96.9 kg 73.936 kg 73.029 kg      Telemetry     Atrial fib  - Personally Reviewed  ECG     - Personally Reviewed  Physical Exam   GEN: middle age man, NAD  Neck: No JVD Cardiac: Irreg. irreg.Marland Kitchen  Respiratory: Clear to auscultation bilaterally. GI: Soft, nontender, non-distended  MS: No edema; No deformity. Neuro:  Nonfocal  Psych: Normal affect   Labs    High Sensitivity Troponin:   Recent Labs  Lab 02/22/21 1345 02/22/21 2053  TROPONINIHS 9 12      Chemistry Recent Labs  Lab 02/22/21 1345  NA 136  K 4.1  CL 101  CO2 29  GLUCOSE 134*  BUN 21*  CREATININE 1.24  CALCIUM 9.2  GFRNONAA >60  ANIONGAP 6     Hematology Recent Labs  Lab 02/22/21 1345  WBC 6.4  RBC 6.35*  HGB 16.2  HCT 50.7  MCV 79.8*  MCH 25.5*  MCHC 32.0  RDW 16.1*  PLT 211    BNPNo results  for input(s): BNP, PROBNP in the last 168 hours.   DDimer No results for input(s): DDIMER in the last 168 hours.   Radiology    DG Chest Portable 1 View  Result Date: 02/22/2021 CLINICAL DATA:  Atrial fibrillation. EXAM: PORTABLE CHEST 1 VIEW COMPARISON:  None. FINDINGS: The heart size and mediastinal contours are within normal limits. Both lungs are clear. The visualized skeletal structures are unremarkable. IMPRESSION: No active disease. Electronically Signed   By: Marijo Conception M.D.   On: 02/22/2021 14:22    Cardiac Studies     Patient Profile     60 y.o. male with new onset Afib and newly diagnosed LBBB   Assessment & Plan    1.   Atrial fib:  Going for TEE / CV today .  Has received 3 doses of eliquis   2.  LBBB:   Scheduled for coronary CT angio later today   I anticipate DC today  Follow up with Dr. [name] .   For questions or updates, please contact [place] Please  consult [email or web address] for contact info under        Signed, Mertie Moores, MD  02/23/2021, 11:28 AM

## 2021-02-23 NOTE — Anesthesia Procedure Notes (Signed)
Procedure Name: MAC Date/Time: 02/23/2021 2:23 PM Performed by: Lowella Dell, CRNA Pre-anesthesia Checklist: Patient identified, Emergency Drugs available, Suction available, Patient being monitored and Timeout performed Oxygen Delivery Method: Nasal cannula Placement Confirmation: positive ETCO2 Dental Injury: Teeth and Oropharynx as per pre-operative assessment

## 2021-02-23 NOTE — Discharge Instructions (Signed)

## 2021-02-23 NOTE — Transfer of Care (Signed)
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, MD  Procedure(s) Performed: TRANSESOPHAGEAL ECHOCARDIOGRAM (TEE) (N/A ) CARDIOVERSION (N/A )  Patient Location: Endoscopy Unit  Anesthesia Type:MAC  Level of Consciousness: awake, alert  and oriented  Airway & Oxygen Therapy: Patient Spontanous Breathing  Post-op Assessment: Report given to RN, Post -op Vital signs reviewed and stable and Patient moving all extremities  Post vital signs: Reviewed and stable  Last Vitals:  Vitals Value Taken Time  BP    Temp    Pulse    Resp    SpO2      Last Pain:  Vitals:   02/23/21 1500  TempSrc: Oral  PainSc: 0-No pain         Complications: No complications documented.

## 2021-02-23 NOTE — Progress Notes (Signed)
  Echocardiogram 2D Echocardiogram has been performed.  Jennette Dubin 02/23/2021, 3:48 PM

## 2021-02-23 NOTE — Discharge Summary (Addendum)
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 The transesophogeal probe was passed without difficulty through the esophogus of the patient. Sedation performed by different physician.  The patient was monitored while under deep sedation. Anesthestetic sedation was provided intravenously by Anesthesiology: 253.49mg  of Propofol, 60mg  of Lidocaine. Image quality was good. The patient developed  no complications during the procedure. A  successful direct current cardioversion was performed at 120 joules with 1 attempt.   IMPRESSIONS   1. Left ventricular ejection fraction, by estimation, is 35 to 40%. The left ventricle has moderately decreased function. The left ventricle demonstrates regional wall motion abnormalities (see scoring diagram/findings for description). There is moderate hypokinesis of the left ventricular, basal-mid septal wall.   2. Right ventricular systolic function is normal. The right ventricular size is normal.   3. No left atrial/left atrial appendage thrombus was detected.   4. The mitral valve is normal in structure. Trivial mitral valve  regurgitation.   5. The aortic valve is tricuspid. Aortic valve regurgitation is not visualized.   6. There is mild (Grade II) plaque involving the descending aorta.   Conclusion(s)/Recommendation(s): Abnormal EF/septal wall motion. Prominent in afib RVR, will get transthoracic echo in sinus rhythm for further  evaluation.   ECHO 02/23/2021  1. Left ventricular ejection fraction, by estimation, is 40 to 45%. The left ventricle has mildly decreased function. The left ventricle demonstrates regional wall motion abnormalities. Abnormal (paradoxical) septal motion, consistent with left bundle branch block. Left ventricular diastolic parameters are indeterminate.   2. Right ventricular systolic function is mildly reduced. The right ventricular size is normal.   3. The mitral valve is normal in structure. No evidence of mitral valve regurgitation. No evidence of mitral stenosis.   4. The aortic valve is tricuspid. Aortic valve regurgitation is not visualized. No aortic stenosis is present.   5. The inferior vena cava is normal in size with greater than 50% respiratory variability, suggesting right atrial pressure of 3 mmHg.   CARDIAC CT 02/23/2021 Aorta:  Normal size.  No calcifications.  No dissection.   Aortic Valve:  Tri-leaflet.  No calcifications.   Coronary Arteries:  Normal coronary origin.  Left dominance.   Coronary Calcium Score:   Left main: 0   Left anterior descending artery: 0   Left circumflex artery: 0   Right coronary artery: 0   Total: 0   Percentile: 1st for age, sex, and race matched control.   RCA is a small non-dominant artery.  There is no plaque.   Left main is a large artery that gives rise to LAD and LCX arteries. There is a minimal non-obstructive (1-24%) soft plaque in the mid vessel.   LAD is a large vessel that gives rise to twp large Diagonal Branches. There is a minimal non-obstructive (1-24%) soft plaque in the mid vessel.   LCX is a dominant artery that gives rise to two large OM branches. There is no plaque.   Other findings:  Atypical pulmonary vein drainage into the left atrium: Common left pulmonary vein.   Normal left atrial appendage without a sluggish flow in the distal tip but without evidence of thrombus.   Normal size of the pulmonary  artery.   Extra-cardiac findings: See attached radiology report for non-cardiac structures.   IMPRESSION: 1. Coronary calcium score of 0. This was 1st percentile for age, sex, and race matched control.   2. Normal coronary origin with left dominance.   3. CAD-RADS 1. Minimal non-obstructive CAD (1-24%). Consider non-atherosclerotic causes of chest pain. Consider preventive therapy and risk factor modification.   4. Atypical pulmonary vein drainage into the left atrium: Common left pulmonary vein.   5. Normal left atrial appendage without a sluggish flow in the distal tip but without evidence of thrombus.   RECOMMENDATIONS:   Coronary artery calcium (CAC) score is a strong predictor of incident coronary heart disease (CHD) and provides predictive information beyond traditional risk factors. CAC scoring is reasonable to use in the decision to withhold, postpone, or initiate statin therapy in intermediate-risk or selected borderline-risk asymptomatic adults (age 43-75 years and LDL-C ?70 to <190 mg/dL) who do not have diabetes or established atherosclerotic cardiovascular disease (ASCVD).* In intermediate-risk (10-year ASCVD risk ?7.5% to <20%) adults or selected borderline-risk (10-year ASCVD risk ?5% to <7.5%) adults in whom a CAC score is measured for the purpose of making a treatment decision the following recommendations have been made:   If CAC = 0, it is reasonable to withhold statin therapy and reassess in 5 to 10 years, as long as higher risk conditions are absent (diabetes mellitus, family history of premature CHD in first degree relatives (males <55 years; females <65 years), cigarette smoking, LDL ?190 mg/dL or other independent risk factors).     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 male with HTN, DM, FH CAD, who went to [facility] for presyncope after he saw atrial fibrillation on his watch.  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 [name] after his ECG showed atrial fibrillation as well as a new LBBB.  His heart rate was controlled in the 70s.  Dr. [name] recommended that he go to [place] and he did so.  He started himself on Eliquis.  He was given IV fluids, electrolytes and other labs were normal.  He was transferred to [facility].  He was evaluated by Dr. Acie Fredrickson who felt that TEE/cardioversion was the best option.  This was scheduled for 02/23/2021.  On 02/23/2021, he had a TEE cardioversion, getting him into sinus rhythm.  He is to continue the Eliquis for anticoagulation.  Because of his strong family history of coronary artery disease and the new left bundle, an ischemic evaluation was indicated.  A coronary CT was ordered.  The coronary CT showed no significant CAD.  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  No further inpatient work-up is indicated and he is considered stable for discharge, to follow-up as an outpatient.   Did the patient have an acute coronary syndrome (MI, NSTEMI, STEMI, etc) this admission?:  No                               Did the patient have a percutaneous coronary intervention (stent / angioplasty)?:  No.  _____________  Discharge Vitals Blood pressure 113/75, pulse 69, temperature (!) 97.5 F (36.4 C), temperature source Oral, resp. rate 15, height 5\' 7"  (1.702 m), weight 96.9 kg, SpO2 100 %.  Filed Weights   02/22/21 1327 02/23/21 0517  Weight: 73.9 kg 96.9 kg    Labs & Radiologic Studies    CBC Recent Labs    02/22/21 1345  WBC 6.4  NEUTROABS 3.6  HGB 16.2  HCT 50.7  MCV 79.8*  PLT 161   Basic Metabolic Panel Recent Labs    02/22/21 1345  NA 136  K 4.1  CL 101  CO2 29  GLUCOSE 134*  BUN 21*  CREATININE 1.24  CALCIUM 9.2  MG 2.0   Liver Function Tests No results for input(s): AST, ALT, ALKPHOS, BILITOT, PROT, ALBUMIN in the last 72 hours. No results for input(s): LIPASE, AMYLASE in  the last 72 hours. High Sensitivity Troponin:   Recent Labs  Lab 02/22/21 1345 02/22/21 2053  TROPONINIHS 9 12    BNP Invalid input(s): POCBNP D-Dimer No results for input(s): DDIMER in the last 72 hours. Hemoglobin A1C Recent Labs    02/23/21 0144  HGBA1C 7.4*   Fasting Lipid Panel No results for input(s): CHOL, HDL, LDLCALC, TRIG, CHOLHDL, LDLDIRECT in the last 72 hours. Thyroid Function Tests Recent Labs    02/22/21 1345  TSH 2.182   _____________  CT CORONARY MORPH W/CTA COR W/SCORE W/CA W/CM &/OR WO/CM  Addendum Date: 02/23/2021   ADDENDUM REPORT: 02/23/2021 18:21 CLINICAL DATA:  60 Year-old Male Caucasian MESA database comparison EXAM: Cardiac/Coronary  CTA TECHNIQUE: The patient was scanned on a [employer]. FINDINGS: A 100 kV prospective scan was triggered in the descending thoracic aorta at 111 HU's. Axial non-contrast 3 mm slices were carried out through the heart. The data set was analyzed on a dedicated work station and scored using the [facility]. Gantry rotation speed was 250 msecs and collimation was .6 mm. No beta blockade and 0.8 mg of sl NTG was given. The 3D data set was reconstructed in 5% intervals of the 67-82 % of the R-R cycle. Diastolic phases were analyzed on a dedicated work station using MPR, MIP and VRT modes. The patient received 80 cc of contrast. Aorta:  Normal size.  No calcifications.  No dissection. Aortic Valve:  Tri-leaflet.  No calcifications. Coronary Arteries:  Normal coronary origin.  Left dominance. Coronary Calcium Score: Left main: 0 Left anterior descending artery: 0 Left circumflex artery: 0 Right coronary artery: 0 Total: 0 Percentile: 1st for age, sex, and race matched control. RCA is a small non-dominant artery.  There is no plaque. Left main is a large artery that gives rise to LAD and LCX arteries. There is a minimal non-obstructive (1-24%) soft plaque in the mid vessel. LAD is a large vessel that gives rise to twp large  Diagonal Branches. There is a minimal non-obstructive (1-24%) soft plaque in the mid vessel. LCX is a dominant artery that gives rise to two large OM branches. There is no plaque. Other findings: Atypical pulmonary vein drainage into the left atrium: Common left pulmonary vein. Normal left atrial appendage without a sluggish flow in the distal tip but without evidence of thrombus. Normal size of the pulmonary artery. Extra-cardiac findings: See attached radiology report for non-cardiac structures. IMPRESSION: 1. Coronary calcium score of 0. This was 1st percentile for age, sex, and race matched control. 2. Normal coronary origin with left dominance. 3. CAD-RADS 1. Minimal non-obstructive CAD (1-24%).  Consider non-atherosclerotic causes of chest pain. Consider preventive therapy and risk factor modification. 4. Atypical pulmonary vein drainage into the left atrium: Common left pulmonary vein. 5. Normal left atrial appendage without a sluggish flow in the distal tip but without evidence of thrombus. RECOMMENDATIONS: Coronary artery calcium (CAC) score is a strong predictor of incident coronary heart disease (CHD) and provides predictive information beyond traditional risk factors. CAC scoring is reasonable to use in the decision to withhold, postpone, or initiate statin therapy in intermediate-risk or selected borderline-risk asymptomatic adults (age 40-75 years and LDL-C ?70 to <190 mg/dL) who do not have diabetes or established atherosclerotic cardiovascular disease (ASCVD).* In intermediate-risk (10-year ASCVD risk ?7.5% to <20%) adults or selected borderline-risk (10-year ASCVD risk ?5% to <7.5%) adults in whom a CAC score is measured for the purpose of making a treatment decision the following recommendations have been made: If CAC = 0, it is reasonable to withhold statin therapy and reassess in 5 to 10 years, as long as higher risk conditions are absent (diabetes mellitus, family history of premature CHD in  first degree relatives (males <55 years; females <65 years), cigarette smoking, LDL ?190 mg/dL or other independent risk factors). If CAC is 1 to 99, it is reasonable to initiate statin therapy for patients ?59 years of age. If CAC is ?100 or ?75th percentile, it is reasonable to initiate statin therapy at any age. Cardiology referral should be considered for patients with CAC scores ?400 or ?75th percentile. *2018 AHA/ACC/AACVPR/AAPA/ABC/ACPM/ADA/AGS/APhA/ASPC/NLA/PCNA Guideline on the Management of Blood Cholesterol: A Report of the American College of Cardiology/American Heart Association Task Force on Clinical Practice Guidelines. J Am Coll Cardiol. 2019;73(24):3168-3209. Rudean Haskell, MD Electronically Signed   By: Rudean Haskell MD   On: 02/23/2021 18:21   Result Date: 02/23/2021 EXAM: OVER-READ INTERPRETATION  CT CHEST The following report is an over-read performed by radiologist Dr. Vinnie Langton of [facility], [place] on 02/23/2021. This over-read does not include interpretation of cardiac or coronary anatomy or pathology. The coronary calcium score/coronary CTA interpretation by the cardiologist is attached. COMPARISON:  Cardiac CT 09/21/2020. FINDINGS: Within the visualized portions of the thorax there are no suspicious appearing pulmonary nodules or masses, there is no acute consolidative airspace disease, no pleural effusions, no pneumothorax and no lymphadenopathy. Visualized portions of the upper abdomen are unremarkable. There are no aggressive appearing lytic or blastic lesions noted in the visualized portions of the skeleton. IMPRESSION: No significant incidental noncardiac findings are noted. Electronically Signed: By: Vinnie Langton M.D. On: 02/23/2021 16:14   DG Chest Portable 1 View  Result Date: 02/22/2021 CLINICAL DATA:  Atrial fibrillation. EXAM: PORTABLE CHEST 1 VIEW COMPARISON:  None. FINDINGS: The heart size and mediastinal contours are within normal limits.  Both lungs are clear. The visualized skeletal structures are unremarkable. IMPRESSION: No active disease. Electronically Signed   By: Marijo Conception M.D.   On: 02/22/2021 14:22   ECHOCARDIOGRAM COMPLETE  Result Date: 02/23/2021    ECHOCARDIOGRAM REPORT   Patient Name:   [name]  Date of Exam: 02/23/2021 Medical Rec #:  [id number]  Height:       67.0 in Accession #:    [id number] Weight:       213.6 lb Date of Birth:  Apr 16, 1961 BSA:          2.080 m Patient Age:    61 years   BP:           113/75 mmHg Patient Gender: M  HR:           84 bpm. Exam Location:  Inpatient Procedure: 2D Echo Indications:    Atrial Fibrillation I48.91  History:        Patient has no prior history of Echocardiogram examinations.                 Risk Factors:Hypertension, Dyslipidemia and Diabetes.  Sonographer:    Mikki Santee RDCS (AE) Referring Phys: [id number] [place]  1. Left ventricular ejection fraction, by estimation, is 40 to 45%. The left ventricle has mildly decreased function. The left ventricle demonstrates regional wall motion abnormalities. Abnormal (paradoxical) septal motion, consistent with left bundle branch block. Left ventricular diastolic parameters are indeterminate.  2. Right ventricular systolic function is mildly reduced. The right ventricular size is normal.  3. The mitral valve is normal in structure. No evidence of mitral valve regurgitation. No evidence of mitral stenosis.  4. The aortic valve is tricuspid. Aortic valve regurgitation is not visualized. No aortic stenosis is present.  5. The inferior vena cava is normal in size with greater than 50% respiratory variability, suggesting right atrial pressure of 3 mmHg. FINDINGS  Left Ventricle: Left ventricular ejection fraction, by estimation, is 40 to 45%. The left ventricle has mildly decreased function. The left ventricle demonstrates regional wall motion abnormalities. The left ventricular internal cavity size was normal in  size. There is no left ventricular hypertrophy. Abnormal (paradoxical) septal motion, consistent with left bundle branch block. Left ventricular diastolic parameters are indeterminate. Right Ventricle: The right ventricular size is normal. No increase in right ventricular wall thickness. Right ventricular systolic function is mildly reduced. Left Atrium: Left atrial size was normal in size. Right Atrium: Right atrial size was normal in size. Pericardium: There is no evidence of pericardial effusion. Mitral Valve: The mitral valve is normal in structure. No evidence of mitral valve regurgitation. No evidence of mitral valve stenosis. Tricuspid Valve: The tricuspid valve is normal in structure. Tricuspid valve regurgitation is trivial. Aortic Valve: The aortic valve is tricuspid. Aortic valve regurgitation is not visualized. No aortic stenosis is present. Pulmonic Valve: The pulmonic valve was not well visualized. Pulmonic valve regurgitation is not visualized. Aorta: The aortic root is normal in size and structure. Venous: The inferior vena cava is normal in size with greater than 50% respiratory variability, suggesting right atrial pressure of 3 mmHg. IAS/Shunts: The interatrial septum was not well visualized.  LEFT VENTRICLE PLAX 2D LVIDd:         3.80 cm  Diastology LVIDs:         3.20 cm  LV e' medial:  10.30 cm/s LV PW:         0.90 cm  LV e' lateral: 10.40 cm/s LV IVS:        1.00 cm LVOT diam:     2.10 cm LV SV:         44 LV SV Index:   21 LVOT Area:     3.46 cm  RIGHT VENTRICLE RV S prime:     8.27 cm/s TAPSE (M-mode): 1.5 cm LEFT ATRIUM             Index       RIGHT ATRIUM          Index LA diam:        2.70 cm 1.30 cm/m  RA Area:     9.79 cm LA Vol (A2C):   46.5 ml 22.36 ml/m RA Volume:   16.30  ml 7.84 ml/m LA Vol (A4C):   24.0 ml 11.54 ml/m LA Biplane Vol: 34.5 ml 16.59 ml/m  AORTIC VALVE LVOT Vmax:   72.80 cm/s LVOT Vmean:  45.800 cm/s LVOT VTI:    0.128 m  AORTA Ao Root diam: 3.10 cm  SHUNTS  Systemic VTI:  0.13 m Systemic Diam: 2.10 cm Oswaldo Milian MD Electronically signed by Oswaldo Milian MD Signature Date/Time: 02/23/2021/7:06:24 PM    Final    ECHO TEE  Result Date: 02/23/2021    TRANSESOPHOGEAL ECHO REPORT   Patient Name:   [name]  Date of Exam: 02/23/2021 Medical Rec #:  [id number]  Height:       67.0 in Accession #:    [id number] Weight:       213.6 lb Date of Birth:  05/02/1961 BSA:          2.080 m Patient Age:    34 years   BP:           113/75 mmHg Patient Gender: M          HR:           122 bpm. Exam Location:  Inpatient Procedure: Transesophageal Echo, Color Doppler and Cardiac Doppler Indications:     Atrial Fibrillation  History:         Patient has prior history of Echocardiogram examinations. Risk                  Factors:Hypertension, Dyslipidemia and Diabetes.  Sonographer:     Mikki Santee RDCS (AE) Referring Phys:  [id number] Leanor Kail Diagnosing Phys: Buford Dresser MD PROCEDURE: After discussion of the risks and benefits of a TEE, an informed consent was obtained from the patient. The transesophogeal probe was passed without difficulty through the esophogus of the patient. Sedation performed by different physician. The patient was monitored while under deep sedation. Anesthestetic sedation was provided intravenously by Anesthesiology: 253.49mg  of Propofol, 60mg  of Lidocaine. Image quality was good. The patient developed no complications during the procedure. A successful direct current cardioversion was performed at 120 joules with 1 attempt. IMPRESSIONS  1. Left ventricular ejection fraction, by estimation, is 35 to 40%. The left ventricle has moderately decreased function. The left ventricle demonstrates regional wall motion abnormalities (see scoring diagram/findings for description). There is moderate hypokinesis of the left ventricular, basal-mid septal wall.  2. Right ventricular systolic function is normal. The right ventricular size is  normal.  3. No left atrial/left atrial appendage thrombus was detected.  4. The mitral valve is normal in structure. Trivial mitral valve regurgitation.  5. The aortic valve is tricuspid. Aortic valve regurgitation is not visualized.  6. There is mild (Grade II) plaque involving the descending aorta. Conclusion(s)/Recommendation(s): Abnormal EF/septal wall motion. Prominent in afib RVR, will get transthoracic echo in sinus rhythm for further evaluation. FINDINGS  Left Ventricle: Left ventricular ejection fraction, by estimation, is 35 to 40%. The left ventricle has moderately decreased function. The left ventricle demonstrates regional wall motion abnormalities. Moderate hypokinesis of the left ventricular, basal-mid septal wall. The left ventricular internal cavity size was normal in size. Right Ventricle: The right ventricular size is normal. No increase in right ventricular wall thickness. Right ventricular systolic function is normal. Left Atrium: Left atrial size was normal in size. No left atrial/left atrial appendage thrombus was detected. Right Atrium: Right atrial size was normal in size. Pericardium: Trivial pericardial effusion is present. Mitral Valve: The mitral valve is normal in structure. Trivial mitral valve regurgitation.  Tricuspid Valve: The tricuspid valve is normal in structure. Tricuspid valve regurgitation is trivial. Aortic Valve: The aortic valve is tricuspid. Aortic valve regurgitation is not visualized. Pulmonic Valve: The pulmonic valve was normal in structure. Pulmonic valve regurgitation is trivial. Aorta: The aortic root and ascending aorta are structurally normal, with no evidence of dilitation. There is mild (Grade II) plaque involving the descending aorta. IAS/Shunts: No atrial level shunt detected by color flow Doppler. Buford Dresser MD Electronically signed by Buford Dresser MD Signature Date/Time: 02/23/2021/7:18:26 PM    Final    Disposition   Pt is being  discharged home today in good condition.  Follow-up Plans & Appointments     Follow-up Information     Richardo Priest, MD Follow up on 02/27/2021.   Specialties: Cardiology, Radiology Why: In general, you can follow up in the [place] office, but this appointment is in the [facility] office due to scheduling issues. Contact information: [place] [address] [phone]                Discharge Instructions     Diet - low sodium heart healthy   Complete by: As directed    Increase activity slowly   Complete by: As directed        Discharge Medications   Allergies as of 02/23/2021   No Known Allergies      Medication List     STOP taking these medications    Na Sulfate-K Sulfate-Mg Sulf 17.5-3.13-1.6 GM/177ML Soln   pantoprazole 40 MG tablet Commonly known as: PROTONIX       TAKE these medications    Eliquis 5 MG Tabs tablet Generic drug: apixaban Take 1 tablet (5 mg total) by mouth 2 (two) times daily.   insulin aspart 100 UNIT/ML injection Commonly known as: novoLOG Inject 5-7 Units into the skin 2 (two) times daily. Using 5 units in the AM and 7 units in the evening.   lisinopril 10 MG tablet Commonly known as: ZESTRIL Take 10 mg by mouth daily.   metformin 500 MG (OSM) 24 hr tablet Commonly known as: FORTAMET Take 1,000 mg by mouth 2 (two) times daily. Notes to patient: HOLD for 48 hours, resume on 02/26/2021.   rosuvastatin 10 MG tablet Commonly known as: CRESTOR Take 10 mg by mouth daily.   Trulicity 3 AS/5.0NL Sopn Generic drug: Dulaglutide Inject 3 mg into the skin once a week. Sunday   Vitamin D 125 MCG (5000 UT) Caps Take 5,000 Units by mouth once a week.           Outstanding Labs/Studies   Final read on echo, TEE  Duration of Discharge Encounter   Greater than 30 minutes including physician time.  Signed, Rosaria Ferries, PA-C 02/24/2021, 5:06 PM   Attending Note:   The patient  was seen and examined.  Agree with assessment and plan as noted above.  Changes made to the above note as needed.  Patient seen and independently examined with  Rosaria Ferries, PA .   We discussed all aspects of the encounter. I agree with the assessment and plan as stated above.    Atrial fib:  pt was cardioverted. Cont eliquis   2.   LBBB:   initial work up is negative.   Coronary CT angio shows a coronary calcium score of 0 with minimal CAD   He will follow up with Dr. [name].   I have spent a total of 40 minutes  with patient reviewing hospital  notes , telemetry, EKGs, labs and examining patient as well as establishing an assessment and plan that was discussed with the patient.  > 50% of time was spent in direct patient care.    Thayer Headings, Brooke Bonito., MD, [facility] 02/24/2021, 7:28 PM [address],  [place] Pager [phone]   \

## 2021-02-23 NOTE — Progress Notes (Signed)
IV access and Telemetry monitor removed, no issues noted. Patient and family provided with discharge education and materials, verbalized understanding of all information provided. Patient discharged with all belongings.

## 2021-02-23 NOTE — Progress Notes (Signed)
  Echocardiogram Transesophageal has been performed.  Jennette Dubin 02/23/2021, 3:40 PM

## 2021-02-26 ENCOUNTER — Encounter (HOSPITAL_COMMUNITY): Payer: Self-pay | Admitting: Cardiology

## 2021-02-26 DIAGNOSIS — I10 Essential (primary) hypertension: Secondary | ICD-10-CM | POA: Insufficient documentation

## 2021-02-26 DIAGNOSIS — E119 Type 2 diabetes mellitus without complications: Secondary | ICD-10-CM | POA: Insufficient documentation

## 2021-02-26 DIAGNOSIS — I119 Hypertensive heart disease without heart failure: Secondary | ICD-10-CM | POA: Insufficient documentation

## 2021-02-26 DIAGNOSIS — A048 Other specified bacterial intestinal infections: Secondary | ICD-10-CM | POA: Insufficient documentation

## 2021-02-26 DIAGNOSIS — E785 Hyperlipidemia, unspecified: Secondary | ICD-10-CM | POA: Insufficient documentation

## 2021-02-26 DIAGNOSIS — N419 Inflammatory disease of prostate, unspecified: Secondary | ICD-10-CM | POA: Insufficient documentation

## 2021-02-26 NOTE — Progress Notes (Signed)
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 PAF (paroxysmal atrial fibrillation) ([place])   2. Chronic anticoagulation   3. Hypertensive heart disease without heart failure   4. Other cardiomyopathy ([place])   5. LBBB (left bundle branch block)   6. Type 2 diabetes mellitus without complication, without long-term current use of insulin (HCC)    PLAN:    In order of problems listed above:  1. Maintaining sinus rhythm we discussed the natural history of atrial fibrillation to recur and I asked him before we see him back in the office in 2 weeks to think about whether he should place him on amiodarone refer for EP catheter ablation I prefer the latter 2. Continue anticoagulation transition to Xarelto his request 3. Initiate low-dose beta-blocker Toprol-XL 12 and half milligrams daily and Entresto starting Friday 2426 1 daily he restarted his Jardiance.   Next appointment: 2 weeks   Medication Adjustments/Labs and Tests Ordered: Current medicines are reviewed at length with the patient today.  Concerns regarding medicines are outlined above.  Orders Placed This Encounter  Procedures  . EKG 12-Lead   No orders of the defined types were placed in this encounter.   Chief Complaint  Patient presents with  . Hospitalization Follow-up  . Follow-up  . Atrial Fibrillation  . 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  He also had a cardiac CTA performed with his calcium score of 0 and minimal nonobstructive CAD and the eft atrial appendage had no evidence of thrombus.  His EKG after  cardioversion shows sinus rhythm left bundle branch block QRS duration 158 ms  Compliance with diet, lifestyle and medications: Yes  He has had lightheadedness concerns since due to extraparametal discontinued transition of Xarelto 20 mg daily His home blood pressure runs 213 086 systolic today in my office 128/80 sitting 122/76 standing No shortness of breath palpitation he just feels fatigued no chest pain. He questions whether his cardiomyopathy is due to vaccine its not the right pattern has is last dose was months ago He questions whether this is due to Covid that he had after the first year brief outpatient case fully vaccinated and although it is possible I think it is unlikely and will go ahead and do a cardiac MRI looking for evidence of post Covid LGE or patterns of infiltrative cardiomyopathy. Past Medical History:  Diagnosis Date  . Diabetes ([place])   . Dyslipidemia   . H. pylori infection    Treated in 2009 or 2010  . Hypertension   . Prostatitis     Past Surgical History:  Procedure Laterality Date  . CARDIOVERSION N/A 02/23/2021   Procedure: CARDIOVERSION;  Surgeon: Buford Dresser, MD;  Location: [facility] ENDOSCOPY;  Service: Cardiovascular;  Laterality: N/A;  . CATARACT EXTRACTION, BILATERAL    . COLONOSCOPY     around 2009 or 2010. Was normal. [facility] [place]  . ESOPHAGOGASTRODUODENOSCOPY     Around 2009 or 2010. [facility] [place] Treated for H pylori  . NOSE SURGERY    . TEE WITHOUT  CARDIOVERSION N/A 02/23/2021   Procedure: TRANSESOPHAGEAL ECHOCARDIOGRAM (TEE);  Surgeon: Buford Dresser, MD;  Location: [facility] ENDOSCOPY;  Service: Cardiovascular;  Laterality: N/A;    Current Medications: Current Meds  Medication Sig  . apixaban (ELIQUIS) 5 MG TABS tablet Take 1 tablet (5 mg total) by mouth 2 (two) times daily.  . Cholecalciferol (VITAMIN D) 125 MCG (5000 UT) CAPS Take 5,000 Units by mouth once a week.  . insulin aspart (NOVOLOG) 100 UNIT/ML injection Inject 5-7 Units  into the skin 2 (two) times daily. Using 5 units in the AM and 7 units in the evening.  Marland Kitchen lisinopril (PRINIVIL,ZESTRIL) 10 MG tablet Take 10 mg by mouth daily.  . metformin (FORTAMET) 500 MG (OSM) 24 hr tablet Take 1,000 mg by mouth 2 (two) times daily.   . rosuvastatin (CRESTOR) 10 MG tablet Take 10 mg by mouth daily.  . TRULICITY 3 WS/5.6CL SOPN Inject 3 mg into the skin once a week. Sunday     Allergies:   Patient has no known allergies.   Social History   Socioeconomic History  . Marital status: Married    Spouse name: Not on file  . Number of children: 4  . Years of education: Not on file  . Highest education level: Not on file  Occupational History  . Occupation: Physician  Tobacco Use  . Smoking status: Never Smoker  . Smokeless tobacco: Never Used  Vaping Use  . Vaping Use: Never used  Substance and Sexual Activity  . Alcohol use: Never  . Drug use: Never  . Sexual activity: Not on file  Other Topics Concern  . Not on file  Social History Narrative  . Not on file   Social Determinants of Health   Financial Resource Strain: Not on file  Food Insecurity: Not on file  Transportation Needs: Not on file  Physical Activity: Not on file  Stress: Not on file  Social Connections: Not on file     Family History: The patient's family history includes Breast cancer in his cousin; CAD in his brother and father; Diabetes in his brother; Hypertension in his mother. There is no history of Colon cancer or Esophageal cancer. ROS:   Please see the history of present illness.    All other systems reviewed and are negative.  EKGs/Labs/Other Studies Reviewed:    The following studies were reviewed today:  EKG:  EKG ordered today and personally reviewed.  The ekg ordered today demonstrates sinus rhythm left bundle branch block first-degree AV block  Recent Labs: 02/22/2021: BUN 21; Creatinine, Ser 1.24; Hemoglobin 16.2; Magnesium 2.0; Platelets 211; Potassium 4.1; Sodium 136;  TSH 2.182  Recent Lipid Panel No results found for: CHOL, TRIG, HDL, CHOLHDL, VLDL, LDLCALC, LDLDIRECT  Physical Exam:    VS:  BP 100/70 (BP Location: Left Arm, Patient Position: Sitting, Cuff Size: Normal)   Pulse 76   Ht 5\' 7"  (1.702 m)   Wt 160 lb (72.6 kg)   SpO2 99%   BMI 25.06 kg/m     Wt Readings from Last 3 Encounters:  02/27/21 160 lb (72.6 kg)  02/23/21 213 lb 10 oz (96.9 kg)  05/25/19 161 lb (73 kg)     GEN:  Well nourished, well developed in no acute distress HEENT: Normal NECK: No JVD; No carotid bruits LYMPHATICS: No lymphadenopathy CARDIAC: S2 paradoxical RRR, no murmurs, rubs, gallops RESPIRATORY:  Clear to auscultation without rales, wheezing or rhonchi  ABDOMEN: Soft, non-tender, non-distended MUSCULOSKELETAL:  No edema; No deformity  SKIN: Warm and dry NEUROLOGIC:  Alert and oriented x 3 PSYCHIATRIC:  Normal affect    Signed, Shirlee More, MD  02/27/2021 2:47 PM    [place]

## 2021-02-27 ENCOUNTER — Encounter: Payer: Self-pay | Admitting: Cardiology

## 2021-02-27 ENCOUNTER — Ambulatory Visit: Payer: BC Managed Care – PPO | Admitting: Cardiology

## 2021-02-27 ENCOUNTER — Ambulatory Visit (INDEPENDENT_AMBULATORY_CARE_PROVIDER_SITE_OTHER): Payer: BC Managed Care – PPO

## 2021-02-27 ENCOUNTER — Other Ambulatory Visit: Payer: Self-pay

## 2021-02-27 VITALS — BP 100/70 | HR 76 | Ht 67.0 in | Wt 160.0 lb

## 2021-02-27 DIAGNOSIS — E119 Type 2 diabetes mellitus without complications: Secondary | ICD-10-CM

## 2021-02-27 DIAGNOSIS — Z7901 Long term (current) use of anticoagulants: Secondary | ICD-10-CM

## 2021-02-27 DIAGNOSIS — I428 Other cardiomyopathies: Secondary | ICD-10-CM | POA: Diagnosis not present

## 2021-02-27 DIAGNOSIS — I119 Hypertensive heart disease without heart failure: Secondary | ICD-10-CM | POA: Diagnosis not present

## 2021-02-27 DIAGNOSIS — I480 Paroxysmal atrial fibrillation: Secondary | ICD-10-CM | POA: Diagnosis not present

## 2021-02-27 DIAGNOSIS — I447 Left bundle-branch block, unspecified: Secondary | ICD-10-CM

## 2021-02-27 MED ORDER — RIVAROXABAN 20 MG PO TABS: 20.0000 mg | ORAL_TABLET | Freq: Every day | ORAL | 3 refills | Status: DC

## 2021-02-27 MED ORDER — METOPROLOL SUCCINATE ER 25 MG PO TB24: 12.5000 mg | ORAL_TABLET | Freq: Every day | ORAL | 3 refills | Status: DC

## 2021-02-27 MED ORDER — SACUBITRIL-VALSARTAN 24-26 MG PO TABS: 1.0000 | ORAL_TABLET | Freq: Two times a day (BID) | ORAL | 3 refills | Status: DC

## 2021-02-27 NOTE — Patient Instructions (Signed)
Medication Instructions:  Your physician has recommended you make the following change in your medication:  STOP: Eliqius START: Xarelto 20 mg take one tablet by mouth daily.  START: Entresto 24/26 mg take one tablet by mouth twice daily.  START: TOPROL XL 12.5 mg per 1/2 tablet by mouth daily.  *If you need a refill on your cardiac medications before your next appointment, please call your pharmacy*   Lab Work: None If you have labs (blood work) drawn today and your tests are completely normal, you will receive your results only by: Marland Kitchen MyChart Message (if you have MyChart) OR . A paper copy in the mail If you have any lab test that is abnormal or we need to change your treatment, we will call you to review the results.   Testing/Procedures: A zio monitor was ordered today. It will remain on for 14 days. You will then return monitor and event diary in provided box. It takes 1-2 weeks for report to be downloaded and returned to [place]. We will call you with the results. If monitor falls off or has orange flashing light, please call Zio for further instructions.     We have put in an order for you to have a cardiac MRI completed.  Magnetic resonance imaging (MRI) is a painless test that produces images of the inside of the body without using X-rays. During an MRI, strong magnets and radio waves work together in a [employer], political party to form detailed images. MRI images may provide more details about a medical condition than X-rays, CT scans, and ultrasounds can provide.   You may be given earphones to listen for instructions.   You may eat a light breakfast and take medications as ordered.  If a contrast material will be used, an IV will be inserted into one of your veins. Contrast material will be injected into your IV.   You will be asked to remove all metal, including: Watch, jewelry, and other metal objects including hearing aids, hair pieces and dentures. (Braces and fillings normally are not  a problem.)   If contrast material was used:   It will leave your body through your urine within a day. You may be told to drink plenty of fluids to help flush the contrast material out of your system.  TEST WILL TAKE APPROXIMATELY 1 HOUR  PLEASE NOTIFY SCHEDULING AT LEAST 24 HOURS IN ADVANCE IF YOU ARE UNABLE TO KEEP YOUR APPOINTMENT.     Follow-Up: At [facility], you and your health needs are our priority.  As part of our continuing mission to provide you with exceptional heart care, we have created designated Provider Care Teams.  These Care Teams include your primary Cardiologist (physician) and Advanced Practice Providers (APPs -  Physician Assistants and Nurse Practitioners) who all work together to provide you with the care you need, when you need it.  We recommend signing up for the patient portal called "MyChart".  Sign up information is provided on this After Visit Summary.  MyChart is used to connect with patients for Virtual Visits (Telemedicine).  Patients are able to view lab/test results, encounter notes, upcoming appointments, etc.  Non-urgent messages can be sent to your provider as well.   To learn more about what you can do with MyChart, go to [email or web address].    Your next appointment:    The format for your next appointment:   In Person  Provider:   Shirlee More, MD   Other Instructions

## 2021-02-28 ENCOUNTER — Telehealth: Payer: Self-pay | Admitting: Cardiology

## 2021-02-28 DIAGNOSIS — I10 Essential (primary) hypertension: Secondary | ICD-10-CM | POA: Insufficient documentation

## 2021-02-28 DIAGNOSIS — I480 Paroxysmal atrial fibrillation: Secondary | ICD-10-CM | POA: Diagnosis not present

## 2021-02-28 DIAGNOSIS — I700 Atherosclerosis of aorta: Secondary | ICD-10-CM | POA: Insufficient documentation

## 2021-02-28 HISTORY — DX: Atherosclerosis of aorta: I70.0

## 2021-02-28 NOTE — Telephone Encounter (Signed)
Spoke with patient regarding the scheduling of the Cardiac MRI ordered by Dr. Coralee North we hear from the insurance regarding the prior authorization---I will have Burke Keels (radiology tech) call him to schedule

## 2021-03-06 DIAGNOSIS — I447 Left bundle-branch block, unspecified: Secondary | ICD-10-CM | POA: Diagnosis not present

## 2021-03-07 ENCOUNTER — Encounter: Payer: Self-pay | Admitting: Cardiology

## 2021-03-07 ENCOUNTER — Other Ambulatory Visit: Payer: Self-pay

## 2021-03-07 ENCOUNTER — Ambulatory Visit (INDEPENDENT_AMBULATORY_CARE_PROVIDER_SITE_OTHER): Payer: BC Managed Care – PPO | Admitting: Cardiology

## 2021-03-07 VITALS — BP 104/68 | HR 62 | Ht 67.0 in | Wt 158.8 lb

## 2021-03-07 DIAGNOSIS — I447 Left bundle-branch block, unspecified: Secondary | ICD-10-CM

## 2021-03-07 DIAGNOSIS — I480 Paroxysmal atrial fibrillation: Secondary | ICD-10-CM | POA: Diagnosis not present

## 2021-03-07 DIAGNOSIS — I119 Hypertensive heart disease without heart failure: Secondary | ICD-10-CM

## 2021-03-07 DIAGNOSIS — Z7901 Long term (current) use of anticoagulants: Secondary | ICD-10-CM | POA: Diagnosis not present

## 2021-03-07 MED ORDER — SACUBITRIL-VALSARTAN 24-26 MG PO TABS: 1.0000 | ORAL_TABLET | Freq: Every day | ORAL | 3 refills | Status: DC

## 2021-03-07 NOTE — Patient Instructions (Signed)
Medication Instructions:  Your physician has recommended you make the following change in your medication:  DECREASE: Entresto 24/26 mg take one tablet by mouth daily.  *If you need a refill on your cardiac medications before your next appointment, please call your pharmacy*   Lab Work: None If you have labs (blood work) drawn today and your tests are completely normal, you will receive your results only by: Marland Kitchen MyChart Message (if you have MyChart) OR . A paper copy in the mail If you have any lab test that is abnormal or we need to change your treatment, we will call you to review the results.   Testing/Procedures: None   Follow-Up: At [facility], you and your health needs are our priority.  As part of our continuing mission to provide you with exceptional heart care, we have created designated Provider Care Teams.  These Care Teams include your primary Cardiologist (physician) and Advanced Practice Providers (APPs -  Physician Assistants and Nurse Practitioners) who all work together to provide you with the care you need, when you need it.  We recommend signing up for the patient portal called "MyChart".  Sign up information is provided on this After Visit Summary.  MyChart is used to connect with patients for Virtual Visits (Telemedicine).  Patients are able to view lab/test results, encounter notes, upcoming appointments, etc.  Non-urgent messages can be sent to your provider as well.   To learn more about what you can do with MyChart, go to [email or web address].    Your next appointment:   3 week(s)  The format for your next appointment:   In Person  Provider:   Shirlee More, MD   Other Instructions

## 2021-03-07 NOTE — Progress Notes (Signed)
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 PAF (paroxysmal atrial fibrillation) ([place])   2. Chronic anticoagulation   3. Hypertensive heart disease without heart failure   4. LBBB (left bundle branch block)    PLAN:    In order of problems listed above:  1. Stable for now maintaining sinus rhythm continue anticoagulant and beta-blocker await results of MRI and decision regarding potential of EP catheter ablation 2. Reduce his Entresto to once daily once he is finished fasting go back to twice daily and add [place]   Next appointment: 3 weeks   Medication Adjustments/Labs and Tests Ordered: Current medicines are reviewed at length with the patient today.  Concerns regarding medicines are outlined above.  No orders of the defined types were placed in this encounter.  Meds ordered this encounter  Medications  . sacubitril-valsartan (ENTRESTO) 24-26 MG    Sig: Take 1 tablet by mouth daily.    Dispense:  90 tablet    Refill:  3    No chief complaint on file.   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   He had a cardiac CTA performed with his calcium score of 0 and minimal nonobstructive CAD and the the left  atrial appendage had no evidence of thrombus.  His EKG after cardioversion shows sinus rhythm left bundle branch block QRS duration 158 ms.  He was last seen 02/27/2021 and initiated on Entresto and continued on beta-blocker and anticoagulant.  His live monitor has shown sinus rhythm left bundle branch block rates 60 to 80 bpm Compliance with diet, lifestyle and medications: Yes  He continues to fast during Ramadan he has had  lightheadedness blood pressure in the 90s and what organ to do is reduce his Entresto to once daily until he is able to get adequate oral intake he will start his first agent and go back to twice daily.  I reviewed his online downloads all sinus rhythm he feels better he is returned to work and except for some lightheadedness and systolic in the 58N no edema shortness of breath chest pain palpitation or syncope Past Medical History:  Diagnosis Date  . Diabetes ([place])   . Dyslipidemia   . H. pylori infection    Treated in 2009 or 2010  . Hypertension   . Prostatitis     Past Surgical History:  Procedure Laterality Date  . CARDIOVERSION N/A 02/23/2021   Procedure: CARDIOVERSION;  Surgeon: Buford Dresser, MD;  Location: [facility] ENDOSCOPY;  Service: Cardiovascular;  Laterality: N/A;  . CATARACT EXTRACTION, BILATERAL    . COLONOSCOPY     around 2009 or 2010. Was normal. [facility] [place]  . ESOPHAGOGASTRODUODENOSCOPY     Around 2009 or 2010. [facility] [place] Treated for H pylori  . NOSE SURGERY    . TEE WITHOUT CARDIOVERSION N/A 02/23/2021   Procedure: TRANSESOPHAGEAL ECHOCARDIOGRAM (TEE);  Surgeon: Buford Dresser, MD;  Location: [facility] ENDOSCOPY;  Service: Cardiovascular;  Laterality: N/A;    Current Medications: Current Meds  Medication Sig  . Cholecalciferol (VITAMIN D) 125 MCG (5000 UT) CAPS Take 5,000 Units by mouth once  a week.  . insulin aspart (NOVOLOG) 100 UNIT/ML injection Inject 5-7 Units into the skin 2 (two) times daily. Using 5 units in the AM and 7 units in the evening.  . metFORMIN (GLUCOPHAGE-XR) 500 MG 24 hr tablet Take 1,000 mg by mouth 2 (two) times daily.  . metoprolol succinate (TOPROL XL) 25 MG 24 hr tablet Take 0.5 tablets (12.5 mg total) by mouth daily.  . rivaroxaban (XARELTO) 20 MG TABS tablet Take 1 tablet (20 mg total) by mouth daily with supper.  . rosuvastatin (CRESTOR) 10 MG tablet Take 10 mg by mouth daily.  . sacubitril-valsartan (ENTRESTO) 24-26 MG Take 1  tablet by mouth daily.  . TRULICITY 3 ER/1.5QM SOPN Inject 3 mg into the skin once a week. Sunday  . [DISCONTINUED] sacubitril-valsartan (ENTRESTO) 24-26 MG Take 1 tablet by mouth 2 (two) times daily.     Allergies:   Patient has no known allergies.   Social History   Socioeconomic History  . Marital status: Married    Spouse name: Not on file  . Number of children: 4  . Years of education: Not on file  . Highest education level: Not on file  Occupational History  . Occupation: Physician  Tobacco Use  . Smoking status: Never Smoker  . Smokeless tobacco: Never Used  Vaping Use  . Vaping Use: Never used  Substance and Sexual Activity  . Alcohol use: Never  . Drug use: Never  . Sexual activity: Not on file  Other Topics Concern  . Not on file  Social History Narrative  . Not on file   Social Determinants of Health   Financial Resource Strain: Not on file  Food Insecurity: Not on file  Transportation Needs: Not on file  Physical Activity: Not on file  Stress: Not on file  Social Connections: Not on file     Family History: The patient's family history includes Breast cancer in his cousin; CAD in his brother and father; Diabetes in his brother; Hypertension in his mother. There is no history of Colon cancer or Esophageal cancer. ROS:   Please see the history of present illness.    All other systems reviewed and are negative.  EKGs/Labs/Other Studies Reviewed:    The following studies were reviewed today:    Recent Labs: 02/22/2021: BUN 21; Creatinine, Ser 1.24; Hemoglobin 16.2; Magnesium 2.0; Platelets 211; Potassium 4.1; Sodium 136; TSH 2.182  Recent Lipid Panel No results found for: CHOL, TRIG, HDL, CHOLHDL, VLDL, LDLCALC, LDLDIRECT  Physical Exam:    VS:  BP 104/68 (BP Location: Right Arm, Patient Position: Sitting)   Pulse 62   Ht 5\' 7"  (1.702 m)   Wt 158 lb 12.8 oz (72 kg)   SpO2 96%   BMI 24.87 kg/m     Wt Readings from Last 3 Encounters:  03/07/21  158 lb 12.8 oz (72 kg)  02/27/21 160 lb (72.6 kg)  02/23/21 213 lb 10 oz (96.9 kg)     GEN:  Well nourished, well developed in no acute distress HEENT: Normal NECK: No JVD; No carotid bruits LYMPHATICS: No lymphadenopathy CARDIAC: RRR, no murmurs, rubs, gallops RESPIRATORY:  Clear to auscultation without rales, wheezing or rhonchi  ABDOMEN: Soft, non-tender, non-distended MUSCULOSKELETAL:  No edema; No deformity  SKIN: Warm and dry NEUROLOGIC:  Alert and oriented x 3 PSYCHIATRIC:  Normal affect    Signed, Shirlee More, MD  03/07/2021 2:29 PM    [place]

## 2021-03-12 ENCOUNTER — Encounter: Payer: Self-pay | Admitting: Cardiology

## 2021-03-12 ENCOUNTER — Ambulatory Visit: Payer: BC Managed Care – PPO | Admitting: Cardiology

## 2021-03-12 ENCOUNTER — Other Ambulatory Visit: Payer: Self-pay

## 2021-03-12 VITALS — BP 115/77 | HR 59 | Ht 67.0 in | Wt 159.0 lb

## 2021-03-12 DIAGNOSIS — E785 Hyperlipidemia, unspecified: Secondary | ICD-10-CM

## 2021-03-12 DIAGNOSIS — E119 Type 2 diabetes mellitus without complications: Secondary | ICD-10-CM

## 2021-03-12 DIAGNOSIS — I4891 Unspecified atrial fibrillation: Secondary | ICD-10-CM | POA: Diagnosis not present

## 2021-03-12 DIAGNOSIS — I119 Hypertensive heart disease without heart failure: Secondary | ICD-10-CM | POA: Diagnosis not present

## 2021-03-12 NOTE — Progress Notes (Signed)
Cardiology Office Note:    Date:  03/12/2021   ID:  Cristian Brothers, [name], DOB 15-Jun-1961, MRN [id number]  PCP:  [name], [name]  Cardiologist:  Jenne Campus, [name]    Referring [name]: [name], [name]   Chief Complaint  Patient presents with  . Follow-up    Abnormal EKG    History of Present Illness:    Cristian Brothers, [name] is a 60 y.o. male with past medical history significant for paroxysmal atrial fibrillation first documented episode happened about 2 weeks ago he required hospitalization TEE being done and he was cardioverted.  Interestingly he did have baseline control ventricular rate.  After that he was put on Entresto as well as metoprolol succinate.  He called earlier today complaining that he is not feeling well he feels kind of dizzy get this emptiness and the top of his head which is kind of unusual sensation, no chest pain tightness squeezing pressure burning chest no shortness of breath no palpitations.  He is seen by me in our office.  He seems to be doing well overall.  He is concerned about his QT prolongation on the EKG.  Denies having any syncope.  Past Medical History:  Diagnosis Date  . Diabetes ([place])   . Dyslipidemia   . H. pylori infection    Treated in 2009 or 2010  . Hypertension   . Prostatitis     Past Surgical History:  Procedure Laterality Date  . CARDIOVERSION N/A 02/23/2021   Procedure: CARDIOVERSION;  Surgeon: Buford Dresser, [name];  Location: [facility] ENDOSCOPY;  Service: Cardiovascular;  Laterality: N/A;  . CATARACT EXTRACTION, BILATERAL    . COLONOSCOPY     around 2009 or 2010. Was normal. [facility] [place]  . ESOPHAGOGASTRODUODENOSCOPY     Around 2009 or 2010. [facility] [place] Treated for H pylori  . NOSE SURGERY    . TEE WITHOUT CARDIOVERSION N/A 02/23/2021   Procedure: TRANSESOPHAGEAL ECHOCARDIOGRAM (TEE);  Surgeon: Buford Dresser, [name];  [facility]COPY;  Service: Cardiovascular;  Laterality: N/A;    Current Medications: Current Meds   Medication Sig  . Cholecalciferol (VITAMIN D) 125 MCG (5000 UT) CAPS Take 5,000 Units by mouth once a week.  . insulin aspart (NOVOLOG) 100 UNIT/ML injection Inject 5-7 Units into the skin 2 (two) times daily. Using 5 units in the AM and 7 units in the evening.  . metFORMIN (GLUCOPHAGE-XR) 500 MG 24 hr tablet Take 1,000 mg by mouth 2 (two) times daily.  . rivaroxaban (XARELTO) 20 MG TABS tablet Take 1 tablet (20 mg total) by mouth daily with supper.  . rosuvastatin (CRESTOR) 10 MG tablet Take 10 mg by mouth daily.  . sacubitril-valsartan (ENTRESTO) 24-26 MG Take 1 tablet by mouth daily.  . TRULICITY 3 WU/9.8JX SOPN Inject 3 mg into the skin once a week. Sunday  . [DISCONTINUED] metoprolol succinate (TOPROL XL) 25 MG 24 hr tablet Take 0.5 tablets (12.5 mg total) by mouth daily.     Allergies:   Patient has no known allergies.   Social History   Socioeconomic History  . Marital status: Married    Spouse name: Not on file  . Number of children: 4  . Years of education: Not on file  . Highest education level: Not on file  Occupational History  . Occupation: Physician  Tobacco Use  . Smoking status: Never Smoker  . Smokeless tobacco: Never Used  Vaping Use  . Vaping Use: Never used  Substance and Sexual Activity  . Alcohol use: Never  .  Drug use: Never  . Sexual activity: Not on file  Other Topics Concern  . Not on file  Social History Narrative  . Not on file   Social Determinants of Health   Financial Resource Strain: Not on file  Food Insecurity: Not on file  Transportation Needs: Not on file  Physical Activity: Not on file  Stress: Not on file  Social Connections: Not on file     Family History: The patient's family history includes Breast cancer in his cousin; CAD in his brother and father; Diabetes in his brother; Hypertension in his mother. There is no history of Colon cancer or Esophageal cancer. ROS:   Please see the history of present illness.    All 14 point  review of systems negative except as described per history of present illness  EKGs/Labs/Other Studies Reviewed:      Recent Labs: 02/22/2021: BUN 21; Creatinine, Ser 1.24; Hemoglobin 16.2; Magnesium 2.0; Platelets 211; Potassium 4.1; Sodium 136; TSH 2.182  Recent Lipid Panel No results found for: CHOL, TRIG, HDL, CHOLHDL, VLDL, LDLCALC, LDLDIRECT  Physical Exam:    VS:  BP 115/77 (BP Location: Left Arm, Patient Position: Lying right side)   Pulse (!) 59   Ht 5\' 7"  (1.702 m)   Wt 159 lb (72.1 kg)   SpO2 97%   BMI 24.90 kg/m     Wt Readings from Last 3 Encounters:  03/12/21 159 lb (72.1 kg)  03/07/21 158 lb 12.8 oz (72 kg)  02/27/21 160 lb (72.6 kg)     GEN:  Well nourished, well developed in no acute distress HEENT: Normal NECK: No JVD; No carotid bruits LYMPHATICS: No lymphadenopathy CARDIAC: RRR, no murmurs, no rubs, no gallops RESPIRATORY:  Clear to auscultation without rales, wheezing or rhonchi  ABDOMEN: Soft, non-tender, non-distended MUSCULOSKELETAL:  No edema; No deformity  SKIN: Warm and dry LOWER EXTREMITIES: no swelling NEUROLOGIC:  Alert and oriented x 3 PSYCHIATRIC:  Normal affect   ASSESSMENT:    1. New onset atrial fibrillation ([place])   2. Hypertensive heart disease without heart failure   3. Type 2 diabetes mellitus without complication, without long-term current use of insulin ([place])   4. Dyslipidemia    PLAN:    In order of problems listed above:  1. Atrial fibrillation.  He is in sinus rhythm with first-degree AV block he also got baseline left bundle branch block.  I asked him to stop his metoprolol he takes only 12.5 mg daily.  Recently also the dose of Entresto has been reduced to only 1 tablet at evening time.  Will see if he feels any better. 2. Concern about EKG to look at his QT interval which is prolonged I told him that in somebody with left bundle branch block will look at the JT interval not QT interval and that is not prolonged.  This is  not of my concern. 3. Type 2 diabetes apparently stable.  He does see how to use a continuous glucose monitor. 4. Left bundle branch block present chronic stable.  He is wearing Zio patch.  Will call [facility] to make sure there is nothing on Zio patch that can explain his symptomatology.  He does have Zio patch AT for 2 weeks. 5. Overall I am puzzled with his symptoms.  Very atypical.  He is mentation is okay overall he seems to be doing fine, I do not think we dealing with early neurological issues.  He does have any signs and symptoms of  neurological problems.  But, I asked him to let me know if there is any worsening. 6.  First-degree AV block with quite long PR interval of 320.  The right situation the patient will be very symptomatic from first-degree AV block sometimes even pacing is recommended and that is the case BiV pacing should be considered but I do not think we even close to reaching that decision.  We will watch for Zio patch we will see if he improves with discontinuation of beta-blocker.  Medication Adjustments/Labs and Tests Ordered: Current medicines are reviewed at length with the patient today.  Concerns regarding medicines are outlined above.  Orders Placed This Encounter  Procedures  . EKG 12-Lead   Medication changes: No orders of the defined types were placed in this encounter.   Signed, Park Liter, [name], [facility] 03/12/2021 10:41 AM    [place]

## 2021-03-12 NOTE — Progress Notes (Signed)
ekg 

## 2021-03-12 NOTE — Patient Instructions (Signed)
Medication Instructions:  Your physician has recommended you make the following change in your medication:  STOP: Metoprolol   *If you need a refill on your cardiac medications before your next appointment, please call your pharmacy*   Lab Work: None. If you have labs (blood work) drawn today and your tests are completely normal, you will receive your results only by: Marland Kitchen MyChart Message (if you have MyChart) OR . A paper copy in the mail If you have any lab test that is abnormal or we need to change your treatment, we will call you to review the results.   Testing/Procedures: None   Follow-Up: At [facility], you and your health needs are our priority.  As part of our continuing mission to provide you with exceptional heart care, we have created designated Provider Care Teams.  These Care Teams include your primary Cardiologist (physician) and Advanced Practice Providers (APPs -  Physician Assistants and Nurse Practitioners) who all work together to provide you with the care you need, when you need it.  We recommend signing up for the patient portal called "MyChart".  Sign up information is provided on this After Visit Summary.  MyChart is used to connect with patients for Virtual Visits (Telemedicine).  Patients are able to view lab/test results, encounter notes, upcoming appointments, etc.  Non-urgent messages can be sent to your provider as well.   To learn more about what you can do with MyChart, go to [email or web address].    Your next appointment:     Follow up as scheduled.

## 2021-03-14 NOTE — Telephone Encounter (Signed)
Burke Keels from radiology left message for patient to call and discuss scheduling the Cardiac MRI ordered by Dr. [name]

## 2021-03-21 ENCOUNTER — Encounter: Payer: Self-pay | Admitting: Cardiology

## 2021-03-21 NOTE — Telephone Encounter (Signed)
Spoke with patient regarding the Thursday 05/03/21 12:00 pm Cardiac MRI appointment at [facility]---arrival time is 11:30 am---1st floor admissions office for check in.  Will mail information to patient and he voiced his understanding.

## 2021-03-27 ENCOUNTER — Telehealth: Payer: Self-pay

## 2021-03-27 NOTE — Telephone Encounter (Signed)
-----   Message from Richardo Priest, MD sent at 03/27/2021 10:36 AM EDT ----- This is a good result No episodes of atrial fibrillation An isolated episode of [place]chebach second-degree AV block I am not concerned by this.

## 2021-03-27 NOTE — Telephone Encounter (Signed)
Left message on patients voicemail to please return our call.   

## 2021-03-28 ENCOUNTER — Telehealth: Payer: Self-pay

## 2021-03-28 NOTE — Telephone Encounter (Signed)
Spoke with patient regarding results and recommendation.  Patient verbalizes understanding and is agreeable to plan of care. Advised patient to call back with any issues or concerns.  

## 2021-03-28 NOTE — Telephone Encounter (Signed)
-----   Message from Brian J Munley, MD sent at 03/27/2021 10:36 AM EDT ----- This is a good result No episodes of atrial fibrillation An isolated episode of Wechebach second-degree AV block I am not concerned by this. 

## 2021-04-04 ENCOUNTER — Other Ambulatory Visit: Payer: Self-pay

## 2021-04-04 ENCOUNTER — Ambulatory Visit (INDEPENDENT_AMBULATORY_CARE_PROVIDER_SITE_OTHER): Payer: BC Managed Care – PPO | Admitting: Cardiology

## 2021-04-04 ENCOUNTER — Encounter: Payer: Self-pay | Admitting: Cardiology

## 2021-04-04 VITALS — BP 90/70 | HR 68 | Ht 67.0 in | Wt 158.0 lb

## 2021-04-04 DIAGNOSIS — I119 Hypertensive heart disease without heart failure: Secondary | ICD-10-CM | POA: Diagnosis not present

## 2021-04-04 DIAGNOSIS — Z7901 Long term (current) use of anticoagulants: Secondary | ICD-10-CM | POA: Diagnosis not present

## 2021-04-04 DIAGNOSIS — E119 Type 2 diabetes mellitus without complications: Secondary | ICD-10-CM

## 2021-04-04 DIAGNOSIS — I480 Paroxysmal atrial fibrillation: Secondary | ICD-10-CM | POA: Diagnosis not present

## 2021-04-04 NOTE — Progress Notes (Signed)
Cardiology Office Note:    Date:  04/04/2021   ID:  Cristian Brothers, [name], DOB 1961/07/31, MRN [id number]  PCP:  [name], [name]  Cardiologist:  Shirlee More, [name]    Referring [name]: [name], [name]    ASSESSMENT:    1. PAF (paroxysmal atrial fibrillation) ([place])   2. Chronic anticoagulation   3. Type 2 diabetes mellitus without complication, without long-term current use of insulin ([place])   4. Hypertensive heart disease without heart failure    PLAN:    In order of problems listed above:  1. Fortunately no recurrence of atrial fibrillation at this time I like him to see EP for an opinion on whether we do clinical observation without suppressant therapy consider an antiarrhythmic drug or pulmonary vein isolation with his cardiomyopathy. 2. Continue his current anticoagulant plus cardioversion 3. Stable diabetes well managed 4. Stable he is tolerating minimal dose of Entresto recheck renal function with proBNP level which was not significantly elevated   Next appointment: 8 weeks   Medication Adjustments/Labs and Tests Ordered: Current medicines are reviewed at length with the patient today.  Concerns regarding medicines are outlined above.  Orders Placed This Encounter  Procedures  . Basic metabolic panel  . Pro b natriuretic peptide (BNP)  . Ambulatory referral to Cardiac Electrophysiology   No orders of the defined types were placed in this encounter.   Chief Complaint  Patient presents with  . Follow-up    History of Present Illness:    Cristian Brothers, [name] is a 60 y.o. male with a hx of paroxysmal atrial fibrillation with cardioversion to sinus rhythm anticoagulated left bundle branch block and hypertensive heart disease without heart failure last seen by me 03/07/2021.  He had a TEE guided cardioversion 02/23/2021.  Echocardiogram showed EF of 40 to 45% with abnormal septal function.  Both atria normal in size and no significant valvular abnormality.  He is a 14-day life event  monitor reported 050 08/2021 showing rare ventricular and supraventricular ectopy he had 4 brief runs of atrial premature contractions and rare Wenke block Mobitz 1 second-degree AV block.  He has remained anticoagulated and is on low-dose Entresto. Compliance with diet, lifestyle and medications: Yes  He is doing better he has some dizziness after vigorous activities not severe and tolerates his dose Entresto No palpitation syncope shortness of breath chest pain or edema. He is due for cardiac MRI 05/03/2021 and because of his atrial fibrillation left bundle branch block cardiomyopathy we will have EP consultation. He tolerates his anticoagulant without bleeding Past Medical History:  Diagnosis Date  . Diabetes ([place])   . Dyslipidemia   . H. pylori infection    Treated in 2009 or 2010  . Hypertension   . Prostatitis     Past Surgical History:  Procedure Laterality Date  . CARDIOVERSION N/A 02/23/2021   Procedure: CARDIOVERSION;  Surgeon: Buford Dresser, [name];  Location[facility] ENDOSCOPY;  Service: Cardiovascular;  Laterality: N/A;  . CATARACT EXTRACTION, BILATERAL    . COLONOSCOPY     around 2009 or 2010. Was normal. [facility] [place]  . ESOPHAGOGASTRODUODENOSCOPY     Around 2009 or 2010. [facility] [place] Treated for H pylori  . NOSE SURGERY    . TEE WITHOUT CARDIOVERSION N/A 02/23/2021   Procedure: TRANSESOPHAGEAL ECHOCARDIOGRAM (TEE);  Surgeon: Buford Dresser, [name];  Location: [facility] ENDOSCOPY;  Service: Cardiovascular;  Laterality: N/A;    Current Medications: Current Meds  Medication Sig  . Cholecalciferol (VITAMIN D) 125 MCG (5000 UT) CAPS  Take 5,000 Units by mouth once a week.  . insulin aspart (NOVOLOG) 100 UNIT/ML injection Inject 5-7 Units into the skin 2 (two) times daily. Using 5 units in the AM and 7 units in the evening.  . metFORMIN (GLUCOPHAGE-XR) 500 MG 24 hr tablet Take 1,000 mg by mouth 2 (two) times daily.  . rivaroxaban (XARELTO) 20 MG TABS tablet Take 1 tablet (20  mg total) by mouth daily with supper.  . rosuvastatin (CRESTOR) 5 MG tablet Take 5 mg by mouth daily.  . sacubitril-valsartan (ENTRESTO) 24-26 MG Take 1 tablet by mouth daily.  . TRULICITY 3 OV/5.6EP SOPN Inject 3 mg into the skin once a week. Sunday     Allergies:   Patient has no known allergies.   Social History   Socioeconomic History  . Marital status: Married    Spouse name: Not on file  . Number of children: 4  . Years of education: Not on file  . Highest education level: Not on file  Occupational History  . Occupation: Physician  Tobacco Use  . Smoking status: Never Smoker  . Smokeless tobacco: Never Used  Vaping Use  . Vaping Use: Never used  Substance and Sexual Activity  . Alcohol use: Never  . Drug use: Never  . Sexual activity: Not on file  Other Topics Concern  . Not on file  Social History Narrative  . Not on file   Social Determinants of Health   Financial Resource Strain: Not on file  Food Insecurity: Not on file  Transportation Needs: Not on file  Physical Activity: Not on file  Stress: Not on file  Social Connections: Not on file     Family History: The patient's family history includes Breast cancer in his cousin; CAD in his brother and father; Diabetes in his brother; Hypertension in his mother. There is no history of Colon cancer or Esophageal cancer. ROS:   Please see the history of present illness.    All other systems reviewed and are negative.  EKGs/Labs/Other Studies Reviewed:    The following studies were reviewed today:    Recent Labs: 02/22/2021: BUN 21; Creatinine, Ser 1.24; Hemoglobin 16.2; Magnesium 2.0; Platelets 211; Potassium 4.1; Sodium 136; TSH 2.182  Recent Lipid Panel No results found for: CHOL, TRIG, HDL, CHOLHDL, VLDL, LDLCALC, LDLDIRECT  Physical Exam:    VS:  BP 90/70 (BP Location: Right Arm, Patient Position: Sitting, Cuff Size: Normal)   Pulse 68   Ht 5\' 7"  (1.702 m)   Wt 158 lb (71.7 kg)   SpO2 97%   BMI  24.75 kg/m     Wt Readings from Last 3 Encounters:  04/04/21 158 lb (71.7 kg)  03/12/21 159 lb (72.1 kg)  03/07/21 158 lb 12.8 oz (72 kg)     GEN: He does not appear ill well nourished, well developed in no acute distress HEENT: Normal NECK: No JVD; No carotid bruits LYMPHATICS: No lymphadenopathy CARDIAC: S2 paradoxical RRR, no murmurs, rubs, gallops RESPIRATORY:  Clear to auscultation without rales, wheezing or rhonchi  ABDOMEN: Soft, non-tender, non-distended MUSCULOSKELETAL:  No edema; No deformity  SKIN: Warm and dry NEUROLOGIC:  Alert and oriented x 3 PSYCHIATRIC:  Normal affect    Signed, Shirlee More, [name]  04/04/2021 1:49 PM    [facility]

## 2021-04-04 NOTE — Patient Instructions (Signed)
Medication Instructions:  Your physician recommends that you continue on your current medications as directed. Please refer to the Current Medication list given to you today.  *If you need a refill on your cardiac medications before your next appointment, please call your pharmacy*   Lab Work: Your physician recommends that you return for lab work in: TODAY BMP, ProBNP If you have labs (blood work) drawn today and your tests are completely normal, you will receive your results only by: Marland Kitchen MyChart Message (if you have MyChart) OR . A paper copy in the mail If you have any lab test that is abnormal or we need to change your treatment, we will call you to review the results.   Testing/Procedures: None   Follow-Up: At [facility], you and your health needs are our priority.  As part of our continuing mission to provide you with exceptional heart care, we have created designated Provider Care Teams.  These Care Teams include your primary Cardiologist (physician) and Advanced Practice Providers (APPs -  Physician Assistants and Nurse Practitioners) who all work together to provide you with the care you need, when you need it.  We recommend signing up for the patient portal called "MyChart".  Sign up information is provided on this After Visit Summary.  MyChart is used to connect with patients for Virtual Visits (Telemedicine).  Patients are able to view lab/test results, encounter notes, upcoming appointments, etc.  Non-urgent messages can be sent to your provider as well.   To learn more about what you can do with MyChart, go to [email or web address].    Your next appointment:   8 week(s)  The format for your next appointment:   In Person  Provider:   Shirlee More, MD   Other Instructions

## 2021-04-05 ENCOUNTER — Telehealth: Payer: Self-pay

## 2021-04-05 LAB — BASIC METABOLIC PANEL
BUN/Creatinine Ratio: 15 (ref 9–20)
BUN: 17 mg/dL (ref 6–24)
CO2: 21 mmol/L (ref 20–29)
Calcium: 9.4 mg/dL (ref 8.7–10.2)
Chloride: 103 mmol/L (ref 96–106)
Creatinine, Ser: 1.12 mg/dL (ref 0.76–1.27)
Glucose: 104 mg/dL — ABNORMAL HIGH (ref 65–99)
Potassium: 4.4 mmol/L (ref 3.5–5.2)
Sodium: 140 mmol/L (ref 134–144)
eGFR: 76 mL/min/{1.73_m2} (ref >59)

## 2021-04-05 LAB — PRO B NATRIURETIC PEPTIDE: NT-Pro BNP: 17 pg/mL (ref 0–210)

## 2021-04-05 NOTE — Telephone Encounter (Signed)
Spoke with patient regarding results and recommendation.  Patient verbalizes understanding and is agreeable to plan of care. Advised patient to call back with any issues or concerns.  

## 2021-04-30 ENCOUNTER — Other Ambulatory Visit (HOSPITAL_COMMUNITY): Payer: BC Managed Care – PPO

## 2021-05-02 ENCOUNTER — Telehealth (HOSPITAL_COMMUNITY): Payer: Self-pay | Admitting: Emergency Medicine

## 2021-05-02 NOTE — Telephone Encounter (Signed)
Reaching out to patient to offer assistance regarding upcoming cardiac imaging study; pt verbalizes understanding of appt date/time, parking situation and where to check in, and verified current allergies; name and call back number provided for further questions should they arise Marchia Bond RN Navigator Cardiac Imaging [place] [facility] [phone] office [phone] cell  Pt reports he wears the Dexcom G6 continuous glucose monitor and I checked and it is MR unsafe, I advised patient to remove prior to the appt. Pt denies claustrophobia Clarise Cruz

## 2021-05-03 ENCOUNTER — Other Ambulatory Visit: Payer: Self-pay

## 2021-05-03 ENCOUNTER — Ambulatory Visit: Payer: BC Managed Care – PPO | Admitting: Cardiology

## 2021-05-03 ENCOUNTER — Ambulatory Visit (HOSPITAL_COMMUNITY)
Admission: RE | Admit: 2021-05-03 | Discharge: 2021-05-03 | Disposition: A | Discharge status: Home / Self Care | Payer: BC Managed Care – PPO | Source: Ambulatory Visit | Attending: Cardiology | Admitting: Cardiology

## 2021-05-03 ENCOUNTER — Encounter: Payer: Self-pay | Admitting: Cardiology

## 2021-05-03 VITALS — BP 104/72 | HR 59 | Ht 67.0 in | Wt 159.0 lb

## 2021-05-03 DIAGNOSIS — I480 Paroxysmal atrial fibrillation: Secondary | ICD-10-CM | POA: Diagnosis not present

## 2021-05-03 DIAGNOSIS — I428 Other cardiomyopathies: Secondary | ICD-10-CM

## 2021-05-03 MED ORDER — GADOBUTROL 1 MMOL/ML IV SOLN
8.0000 mL | Freq: Once | INTRAVENOUS | Status: AC | PRN
  Administered 2021-05-03: 8 mL via INTRAVENOUS

## 2021-05-03 NOTE — Patient Instructions (Signed)
Medication Instructions:  °Your physician recommends that you continue on your current medications as directed. Please refer to the Current Medication list given to you today. ° °*If you need a refill on your cardiac medications before your next appointment, please call your pharmacy* ° ° °Lab Work: °None ordered ° ° °Testing/Procedures: °None ordered ° ° °Follow-Up: °At [facility], you and your health needs are our priority.  As part of our continuing mission to provide you with exceptional heart care, we have created designated Provider Care Teams.  These Care Teams include your primary Cardiologist (physician) and Advanced Practice Providers (APPs -  Physician Assistants and Nurse Practitioners) who all work together to provide you with the care you need, when you need it. ° °Your next appointment:   °6 month(s) ° °The format for your next appointment:   °In Person ° °Provider:   °Will Camnitz, MD ° ° ° °Thank you for choosing [facility]!! ° ° °Camri Molloy, RN °[phone] °  °

## 2021-05-03 NOTE — Progress Notes (Signed)
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 Presenting today for electrophysiology evaluation.  He has a history significant for atrial fibrillation, hypertension, and heart failure.  He had an echo that showed an ejection fraction of 40 to 45% with a left bundle branch block.  He developed atrial fibrillation with weakness and fatigue.  He had a cardioversion 02/23/2021.  He has had no further episodes of atrial fibrillation since his cardioversion.  Today, he denies symptoms of palpitations, chest pain, shortness of breath, orthopnea, PND, lower extremity edema, claudication, dizziness, presyncope, syncope, bleeding, or neurologic sequela. The patient is tolerating medications without difficulties.  Since being back in rhythm, he has felt better.  He does continue to have episodes of fatigue, though overall he is improved.  He was started on Entresto and has noted that his blood pressure has gone down into the Blanchard 100s to 90s.  He thinks that his fatigue and mild dizziness is due to Blanchard blood pressures.   Past Medical History:  Diagnosis Date   Diabetes ([place])    Dyslipidemia    H. pylori infection    Treated in 2009 or 2010   Hypertension    Prostatitis    Past Surgical History:  Procedure Laterality Date   CARDIOVERSION N/A 02/23/2021   Procedure: CARDIOVERSION;  Surgeon: Buford Dresser, MD;  Location: [facility] ENDOSCOPY;  Service: Cardiovascular;  Laterality: N/A;   CATARACT EXTRACTION, BILATERAL     COLONOSCOPY     around 2009 or 2010. Was normal. [place]   ESOPHAGOGASTRODUODENOSCOPY     Around 2009 or 2010. [place] Treated for H  pylori   NOSE SURGERY     TEE WITHOUT CARDIOVERSION N/A 02/23/2021   Procedure: TRANSESOPHAGEAL ECHOCARDIOGRAM (TEE);  Surgeon: Buford Dresser, MD;  Location[facility] ENDOSCOPY;  Service: Cardiovascular;  Laterality: N/A;     Current Outpatient Medications  Medication Sig Dispense Refill   Cholecalciferol (VITAMIN D) 125 MCG (5000 UT) CAPS Take 5,000 Units by mouth once a week.     insulin aspart (NOVOLOG) 100 UNIT/ML injection Inject 5-7 Units into the skin 2 (two) times daily. Using 5 units in the AM and 7 units in the evening.     metFORMIN (GLUCOPHAGE-XR) 500 MG 24 hr tablet Take 1,000 mg by mouth 2 (two) times daily.     rivaroxaban (XARELTO) 20 MG TABS tablet Take 1 tablet (20 mg total) by mouth daily with supper. 90 tablet 3   rosuvastatin (CRESTOR) 5 MG tablet Take 5 mg by mouth daily.     sacubitril-valsartan (ENTRESTO) 24-26 MG Take 1 tablet by mouth daily. 90 tablet 3   TRULICITY 3 VC/9.4WH SOPN Inject 3 mg into the skin once a week. Sunday     No current facility-administered medications for this visit.    Allergies:   Patient has no known allergies.   Social History:  The patient  reports that he has never smoked. He has never used smokeless tobacco. He reports that he does not drink alcohol and does not use drugs.   Family History:  The patient's family history includes Breast cancer in his  cousin; CAD in his brother and father; Diabetes in his brother; Hypertension in his mother.    ROS:  Please see the history of present illness.   Otherwise, review of systems is positive for none.   All other systems are reviewed and negative.    PHYSICAL EXAM: VS:  There were no vitals taken for this visit. , BMI There is no height or weight on file to calculate BMI. GEN: Well nourished, well developed, in no acute distress  HEENT: normal  Neck: no JVD, carotid bruits, or masses Cardiac: RRR; no murmurs, rubs, or gallops,no edema  Respiratory:  clear to auscultation bilaterally,  normal work of breathing GI: soft, nontender, nondistended, + BS MS: no deformity or atrophy  Skin: warm and dry Neuro:  Strength and sensation are intact Psych: euthymic mood, full affect  EKG:  EKG is ordered today. Personal review of the ekg ordered shows sinus rhythm, first-degree AV block, left bundle branch block  Recent Labs: 02/22/2021: Hemoglobin 16.2; Magnesium 2.0; Platelets 211; TSH 2.182 04/04/2021: BUN 17; Creatinine, Ser 1.12; NT-Pro BNP 17; Potassium 4.4; Sodium 140    Lipid Panel  No results found for: CHOL, TRIG, HDL, CHOLHDL, VLDL, LDLCALC, LDLDIRECT   Wt Readings from Last 3 Encounters:  04/04/21 158 lb (71.7 kg)  03/12/21 159 lb (72.1 kg)  03/07/21 158 lb 12.8 oz (72 kg)      Other studies Reviewed: Additional studies/ records that were reviewed today include: TTE 02/23/21  Review of the above records today demonstrates:   1. Left ventricular ejection fraction, by estimation, is 40 to 45%. The  left ventricle has mildly decreased function. The left ventricle  demonstrates regional wall motion abnormalities. Abnormal (paradoxical)  septal motion, consistent with left bundle  branch block. Left ventricular diastolic parameters are indeterminate.   2. Right ventricular systolic function is mildly reduced. The right  ventricular size is normal.   3. The mitral valve is normal in structure. No evidence of mitral valve  regurgitation. No evidence of mitral stenosis.   4. The aortic valve is tricuspid. Aortic valve regurgitation is not  visualized. No aortic stenosis is present.   5. The inferior vena cava is normal in size with greater than 50%  respiratory variability, suggesting right atrial pressure of 3 mmHg.   Cardiac monitor 03/27/2021 personally reviewed Patient had a min HR of 48 bpm, max HR of 129 bpm, and avg HR of 71 bpm. Predominant underlying rhythm was Sinus Rhythm. First Degree AV Block was present. Bundle Branch Block/IVCD was present. 8  Supraventricular Tachycardia runs occurred, the run with the fastest interval lasting 5 beats with a max rate of 129 bpm, the longest lasting 8 beats with an avg rate of 98 bpm. Second Degree AV Block-Mobitz I (Wenckebach) was present. Isolated SVEs were rare (<1.0%), SVE Couplets were rare (<1.0%), and no SVE  Triplets were present. Isolated VEs were rare (<1.0%), and no VE Couplets or VE Triplets were present.  ASSESSMENT AND PLAN:  1.  Persistent atrial fibrillation: CHA2DS2-VASc of 3.  Currently on Xarelto.  He has not had any further episodes of atrial fibrillation since his cardioversion.  At this point, since he has had 1 episode, he would like to hold off on further therapy.  If he does develop more atrial fibrillation, he would be amenable to ablation.  2.  Hypertension: Currently well controlled  3.  Mild chronic systolic heart failure: Found on most recent echo with an ejection fraction of 40 to  45%.  Plan per primary cardiology.  Cardiac MRI scheduled for today.  Case discussed with primary cardiology  Current medicines are reviewed at length with the patient today.   The patient does not have concerns regarding his medicines.  The following changes were made today:  none  Labs/ tests ordered today include:  No orders of the defined types were placed in this encounter.    Disposition:   FU with Yannis Gumbs 6 months  Signed, Ranya Fiddler [name], MD  05/03/2021 8:19 AM     [facility] [address] [place] [id number] [phone] (office) [phone] (fax)

## 2021-05-15 DIAGNOSIS — R7989 Other specified abnormal findings of blood chemistry: Secondary | ICD-10-CM | POA: Diagnosis not present

## 2021-05-30 ENCOUNTER — Ambulatory Visit: Payer: BC Managed Care – PPO | Admitting: Cardiology

## 2021-06-20 ENCOUNTER — Telehealth: Payer: Self-pay | Admitting: Cardiology

## 2021-06-20 NOTE — Telephone Encounter (Signed)
Patient is calling to inform Dr. [name] he did follow up with Dr. Curt Bears, but he is seeking a 2nd option. He states he is scheduled to see Dr. Remus Blake with [facility] on 07/11/21. He plans to have those office visit notes sent to our office after he is seen.

## 2021-06-26 DIAGNOSIS — H35373 Puckering of macula, bilateral: Secondary | ICD-10-CM | POA: Diagnosis not present

## 2021-06-26 DIAGNOSIS — H43393 Other vitreous opacities, bilateral: Secondary | ICD-10-CM | POA: Diagnosis not present

## 2021-06-26 DIAGNOSIS — E119 Type 2 diabetes mellitus without complications: Secondary | ICD-10-CM | POA: Diagnosis not present

## 2021-06-26 DIAGNOSIS — H04123 Dry eye syndrome of bilateral lacrimal glands: Secondary | ICD-10-CM | POA: Diagnosis not present

## 2021-07-11 DIAGNOSIS — Z7901 Long term (current) use of anticoagulants: Secondary | ICD-10-CM | POA: Diagnosis not present

## 2021-07-11 DIAGNOSIS — I509 Heart failure, unspecified: Secondary | ICD-10-CM | POA: Diagnosis not present

## 2021-07-11 DIAGNOSIS — I495 Sick sinus syndrome: Secondary | ICD-10-CM

## 2021-07-11 DIAGNOSIS — I447 Left bundle-branch block, unspecified: Secondary | ICD-10-CM

## 2021-07-11 DIAGNOSIS — I4891 Unspecified atrial fibrillation: Secondary | ICD-10-CM | POA: Diagnosis not present

## 2021-07-11 DIAGNOSIS — E119 Type 2 diabetes mellitus without complications: Secondary | ICD-10-CM | POA: Diagnosis not present

## 2021-07-11 DIAGNOSIS — I480 Paroxysmal atrial fibrillation: Secondary | ICD-10-CM | POA: Diagnosis not present

## 2021-07-11 DIAGNOSIS — I420 Dilated cardiomyopathy: Secondary | ICD-10-CM

## 2021-07-11 DIAGNOSIS — I110 Hypertensive heart disease with heart failure: Secondary | ICD-10-CM | POA: Diagnosis not present

## 2021-07-11 DIAGNOSIS — I440 Atrioventricular block, first degree: Secondary | ICD-10-CM | POA: Diagnosis not present

## 2021-07-11 DIAGNOSIS — I428 Other cardiomyopathies: Secondary | ICD-10-CM | POA: Diagnosis not present

## 2021-07-11 DIAGNOSIS — Z7984 Long term (current) use of oral hypoglycemic drugs: Secondary | ICD-10-CM | POA: Diagnosis not present

## 2021-07-11 DIAGNOSIS — Z79899 Other long term (current) drug therapy: Secondary | ICD-10-CM | POA: Diagnosis not present

## 2021-07-11 HISTORY — DX: Dilated cardiomyopathy: I42.0

## 2021-07-11 HISTORY — DX: Left bundle-branch block, unspecified: I44.7

## 2021-07-11 HISTORY — DX: Sick sinus syndrome: I49.5

## 2021-07-12 DIAGNOSIS — I4891 Unspecified atrial fibrillation: Secondary | ICD-10-CM | POA: Diagnosis not present

## 2021-07-12 DIAGNOSIS — I447 Left bundle-branch block, unspecified: Secondary | ICD-10-CM | POA: Diagnosis not present

## 2021-07-12 DIAGNOSIS — I499 Cardiac arrhythmia, unspecified: Secondary | ICD-10-CM | POA: Diagnosis not present

## 2021-07-12 DIAGNOSIS — I440 Atrioventricular block, first degree: Secondary | ICD-10-CM | POA: Diagnosis not present

## 2021-07-31 DIAGNOSIS — I447 Left bundle-branch block, unspecified: Secondary | ICD-10-CM | POA: Diagnosis not present

## 2021-07-31 DIAGNOSIS — Z7901 Long term (current) use of anticoagulants: Secondary | ICD-10-CM | POA: Diagnosis not present

## 2021-07-31 DIAGNOSIS — I440 Atrioventricular block, first degree: Secondary | ICD-10-CM | POA: Diagnosis not present

## 2021-07-31 DIAGNOSIS — I4891 Unspecified atrial fibrillation: Secondary | ICD-10-CM | POA: Diagnosis not present

## 2021-07-31 DIAGNOSIS — I4819 Other persistent atrial fibrillation: Secondary | ICD-10-CM | POA: Diagnosis not present

## 2021-08-21 DIAGNOSIS — I495 Sick sinus syndrome: Secondary | ICD-10-CM | POA: Diagnosis not present

## 2021-08-21 DIAGNOSIS — I447 Left bundle-branch block, unspecified: Secondary | ICD-10-CM | POA: Diagnosis not present

## 2021-08-21 DIAGNOSIS — Z7901 Long term (current) use of anticoagulants: Secondary | ICD-10-CM | POA: Diagnosis not present

## 2021-08-21 DIAGNOSIS — I440 Atrioventricular block, first degree: Secondary | ICD-10-CM | POA: Diagnosis not present

## 2021-08-21 DIAGNOSIS — I4891 Unspecified atrial fibrillation: Secondary | ICD-10-CM | POA: Diagnosis not present

## 2021-08-22 DIAGNOSIS — I440 Atrioventricular block, first degree: Secondary | ICD-10-CM | POA: Diagnosis not present

## 2021-08-22 DIAGNOSIS — I495 Sick sinus syndrome: Secondary | ICD-10-CM | POA: Diagnosis not present

## 2021-08-22 DIAGNOSIS — I447 Left bundle-branch block, unspecified: Secondary | ICD-10-CM | POA: Diagnosis not present

## 2021-08-22 DIAGNOSIS — Z950 Presence of cardiac pacemaker: Secondary | ICD-10-CM | POA: Insufficient documentation

## 2021-08-22 DIAGNOSIS — Z7901 Long term (current) use of anticoagulants: Secondary | ICD-10-CM | POA: Diagnosis not present

## 2021-08-22 DIAGNOSIS — I4891 Unspecified atrial fibrillation: Secondary | ICD-10-CM | POA: Diagnosis not present

## 2021-08-22 HISTORY — DX: Presence of cardiac pacemaker: Z95.0

## 2021-09-07 DIAGNOSIS — Z23 Encounter for immunization: Secondary | ICD-10-CM | POA: Diagnosis not present

## 2021-09-13 DIAGNOSIS — Z125 Encounter for screening for malignant neoplasm of prostate: Secondary | ICD-10-CM | POA: Diagnosis not present

## 2021-09-13 DIAGNOSIS — E1169 Type 2 diabetes mellitus with other specified complication: Secondary | ICD-10-CM | POA: Diagnosis not present

## 2021-09-13 DIAGNOSIS — I10 Essential (primary) hypertension: Secondary | ICD-10-CM | POA: Diagnosis not present

## 2021-09-13 DIAGNOSIS — Z0000 Encounter for general adult medical examination without abnormal findings: Secondary | ICD-10-CM | POA: Diagnosis not present

## 2021-09-13 DIAGNOSIS — Z13220 Encounter for screening for lipoid disorders: Secondary | ICD-10-CM | POA: Diagnosis not present

## 2021-09-28 DIAGNOSIS — I480 Paroxysmal atrial fibrillation: Secondary | ICD-10-CM | POA: Diagnosis not present

## 2021-09-28 DIAGNOSIS — R42 Dizziness and giddiness: Secondary | ICD-10-CM | POA: Diagnosis not present

## 2021-09-28 DIAGNOSIS — I4891 Unspecified atrial fibrillation: Secondary | ICD-10-CM | POA: Diagnosis not present

## 2021-09-28 DIAGNOSIS — Z950 Presence of cardiac pacemaker: Secondary | ICD-10-CM | POA: Diagnosis not present

## 2021-09-28 DIAGNOSIS — I498 Other specified cardiac arrhythmias: Secondary | ICD-10-CM | POA: Diagnosis not present

## 2021-09-28 DIAGNOSIS — I428 Other cardiomyopathies: Secondary | ICD-10-CM | POA: Diagnosis not present

## 2021-09-28 DIAGNOSIS — Z79899 Other long term (current) drug therapy: Secondary | ICD-10-CM | POA: Diagnosis not present

## 2021-09-28 DIAGNOSIS — R5383 Other fatigue: Secondary | ICD-10-CM | POA: Diagnosis not present

## 2021-11-01 DIAGNOSIS — E7800 Pure hypercholesterolemia, unspecified: Secondary | ICD-10-CM | POA: Insufficient documentation

## 2021-11-01 HISTORY — DX: Pure hypercholesterolemia, unspecified: E78.00

## 2021-11-03 NOTE — Progress Notes (Deleted)
Cardiology Office Note:    Date:  11/03/2021   ID:  [name], [name], DOB 1961-08-02, MRN [id number]  PCP:  [name], [name]  Cardiologist:  Shirlee More, [name]    Referring [name]: [name], [name]    ASSESSMENT:    No diagnosis found. PLAN:    In order of problems listed above:  ***   Next appointment: ***   Medication Adjustments/Labs and Tests Ordered: Current medicines are reviewed at length with the patient today.  Concerns regarding medicines are outlined above.  No orders of the defined types were placed in this encounter.  No orders of the defined types were placed in this encounter.   No chief complaint on file.  .[facility]  History of Present Illness:    [name], [name] is a 60 y.o. male with a hx of paroxysmal atrial fibrillation with previous cardioversion chronic anticoagulation left bundle branch block hypertensive heart disease without heart failure and cardiomyopathy initial ejection fraction of 40 to 45% last seen by me 04/04/2021.  He had a dual-chamber pacemaker with a left bundle branch block area pacing implanted at [facility] 08/21/2021.  He had postoperative chest pain echocardiogram was described as showing normal ejection fraction 55 to 60% and normal pericardium without effusion.  He was synchronously cardioverted from atrial fibrillation 07/31/2021 [place].  He was initially seen in consultation 07/11/2021 Compliance with diet, lifestyle and medications: *** Past Medical History:  Diagnosis Date   Cardiac pacemaker in situ 08/22/2021   Diabetes ([place])    Dilated cardiomyopathy ([place]) 07/11/2021   Dyslipidemia    H. pylori infection    Treated in 2009 or 2010   Hardening of the aorta (main artery of the heart) ([place]) 02/28/2021   Hypertension    Hypertensive heart disease    LBBB (left bundle branch block) 07/11/2021   Paroxysmal atrial fibrillation ([place]) 02/22/2021   Prostatitis    Pure hypercholesterolemia 11/01/2021   Sinus  node dysfunction ([place]) 07/11/2021   Formatting of this note might be different from the original. Added automatically from request for surgery [id number]   Travel advice encounter 12/15/2018    Past Surgical History:  Procedure Laterality Date   CARDIOVERSION N/A 02/23/2021   Procedure: CARDIOVERSION;  Surgeon: Buford Dresser, [name];  Location: [facility];  Service: Cardiovascular;  Laterality: N/A;   CATARACT EXTRACTION, BILATERAL     COLONOSCOPY     around 2009 or 2010. Was normal. [place]   ESOPHAGOGASTRODUODENOSCOPY     Around 2009 or 2010. [place] Treated for H pylori   NOSE SURGERY     TEE WITHOUT CARDIOVERSION N/A 02/23/2021   Procedure: TRANSESOPHAGEAL ECHOCARDIOGRAM (TEE);  Surgeon: Buford Dresser, [name];  Location: [facility] ENDOSCOPY;  Service: Cardiovascular;  Laterality: N/A;    Current Medications: No outpatient medications have been marked as taking for the 11/06/21 encounter (Appointment) with [name], [name].     Allergies:   Patient has no known allergies.   Social History   Socioeconomic History   Marital status: Married    Spouse name: Not on file   Number of children: 4   Years of education: Not on file   Highest education level: Not on file  Occupational History   Occupation: Physician  Tobacco Use   Smoking status: Never   Smokeless tobacco: Never  Vaping Use   Vaping Use: Never used  Substance and Sexual Activity   Alcohol use: Never   Drug use: Never   Sexual activity: Not  on file  Other Topics Concern   Not on file  Social History Narrative   Not on file   Social Determinants of Health   Financial Resource Strain: Not on file  Food Insecurity: Not on file  Transportation Needs: Not on file  Physical Activity: Not on file  Stress: Not on file  Social Connections: Not on file     Family History: The patient's ***family history includes Breast cancer in his cousin; CAD in his brother and father; Diabetes in his brother;  Hypertension in his mother. There is no history of Colon cancer or Esophageal cancer. ROS:   Please see the history of present illness.    All other systems reviewed and are negative.  EKGs/Labs/Other Studies Reviewed:    The following studies were reviewed today:  EKG:  EKG ordered today and personally reviewed.  The ekg ordered today demonstrates ***  Echocardiogram [facility] 08/22/2021: SUMMARY  Limited TTE  The left ventricular size is normal.  LV ejection fraction = 55-60%.  The right ventricle is normal in size and function.  Device lead in the right ventricle.  Device lead in right atrium.  The IVC is normal in size with an inspiratory collapse of greater then  50%, suggesting normal right atrial pressure.  There is no pericardial effusion.   Cardiac MR 05/04/2021 showed normal left ventricular size wall thickness mildly diminished EF 52% with abnormal septal motion due to left bundle branch block he had no late gadolinium enhancement the right ventricle is normal in size and function EF 54% there is mild left atrial enlargement and normal pericardium.  Recent Labs: 02/22/2021: Hemoglobin 16.2; Magnesium 2.0; Platelets 211; TSH 2.182 04/04/2021: BUN 17; Creatinine, Ser 1.12; NT-Pro BNP 17; Potassium 4.4; Sodium 140  Recent Lipid Panel No results found for: CHOL, TRIG, HDL, CHOLHDL, VLDL, LDLCALC, LDLDIRECT  Physical Exam:    VS:  There were no vitals taken for this visit.    Wt Readings from Last 3 Encounters:  05/03/21 159 lb (72.1 kg)  04/04/21 158 lb (71.7 kg)  03/12/21 159 lb (72.1 kg)     GEN: *** Well nourished, well developed in no acute distress HEENT: Normal NECK: No JVD; No carotid bruits LYMPHATICS: No lymphadenopathy CARDIAC: ***RRR, no murmurs, rubs, gallops RESPIRATORY:  Clear to auscultation without rales, wheezing or rhonchi  ABDOMEN: Soft, non-tender, non-distended MUSCULOSKELETAL:  No edema; No deformity  SKIN: Warm and dry NEUROLOGIC:   Alert and oriented x 3 PSYCHIATRIC:  Normal affect    Signed, Shirlee More, [name]  11/03/2021 1:46 PM    [facility]

## 2021-11-06 ENCOUNTER — Ambulatory Visit: Payer: BC Managed Care – PPO | Admitting: Cardiology

## 2021-11-06 NOTE — Progress Notes (Signed)
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 PAF (paroxysmal atrial fibrillation) ([place])   2. Chronic anticoagulation   3. Hypertensive heart disease without heart failure   4. LBBB (left bundle branch block)   5. Other cardiomyopathy ([place])   6. 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 EF is normalized continue his guideline directed therapy in view of his previous cardiomyopathy Stable pacemaker function followed by [place]   Next appointment: 3 months   Medication Adjustments/Labs and Tests Ordered: Current medicines are reviewed at length with the patient today.  Concerns regarding medicines are outlined above.  No orders of the defined types were placed in this encounter.  No orders of the defined types were placed in this encounter.   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  He had a dual-chamber pacemaker with a left bundle branch block area pacing implanted at [facility] 08/21/2021.  He had postoperative chest pain echocardiogram was described as showing normal ejection fraction 55 to 60% and normal pericardium without effusion.  He was synchronously cardioverted from atrial fibrillation  07/31/2021 [place].  He was initially seen by me in consultation 07/11/2021 and subsequent care [place].  Compliance with diet, lifestyle and medications: Yes  I reviewed his records before the visit and he updated me on his EP care. He is scheduled for pulmonary vein isolation in February. His A. fib burden is about 23% and he feels a little weak and lightheaded with it. His blood pressures are running in the high 90s but he is tolerating Entresto and I think at this time he should remain on guideline directed therapy beta-blocker Entresto. We discussed the potential of a clinical trial with left atrial appendage occlusion with pulmonary vein isolation he is interested and will discuss with his EP physician. Overall he is markedly improved outside of atrial fibrillation he is asymptomatic no edema shortness of breath bleeding complication of his anticoagulant or syncope.  Most recent labs 09/13/2021 LDL 35 cholesterol 97 A1c 6.7 creatinine 1.30  He is wondering whether his cardiomyopathy is related to COVID-19 infection I told him certainly has multiple mechanisms including asynchrony and bundle branch block, tachycardia induced cardiomyopathy and cardiomyopathy from COVID infection. Past Medical History:  Diagnosis Date   Cardiac pacemaker in situ 08/22/2021   Diabetes ([place])    Dilated cardiomyopathy ([place]) 07/11/2021   Dyslipidemia    H. pylori infection    Treated in 2009 or 2010   Hardening of the aorta (main artery of the heart) ([place]) 02/28/2021   Hypertension    Hypertensive heart disease    LBBB (left bundle branch block) 07/11/2021  Paroxysmal atrial fibrillation ([place]) 02/22/2021   Prostatitis    Pure hypercholesterolemia 11/01/2021   Sinus node dysfunction ([place]) 07/11/2021   Formatting of this note might be different from the original. Added automatically from request for surgery [id number]   Travel advice encounter 12/15/2018    Past Surgical History:   Procedure Laterality Date   CARDIOVERSION N/A 02/23/2021   Procedure: CARDIOVERSION;  Surgeon: Buford Dresser, MD;  [facility];  Service: Cardiovascular;  Laterality: N/A;   CATARACT EXTRACTION, BILATERAL     COLONOSCOPY     around 2009 or 2010. Was normal. [place]   ESOPHAGOGASTRODUODENOSCOPY     Around 2009 or 2010. [place] Treated for H pylori   NOSE SURGERY     TEE WITHOUT CARDIOVERSION N/A 02/23/2021   Procedure: TRANSESOPHAGEAL ECHOCARDIOGRAM (TEE);  Surgeon: Buford Dresser, MD;  Location: [facility] ENDOSCOPY;  Service: Cardiovascular;  Laterality: N/A;    Current Medications: Current Meds  Medication Sig   apixaban (ELIQUIS) 5 MG TABS tablet Take 5 mg by mouth 2 (two) times daily.   Cholecalciferol (VITAMIN D) 125 MCG (5000 UT) CAPS Take 5,000 Units by mouth once a week.   Dulaglutide (TRULICITY) 1.5 NT/7.0YF SOPN Inject 1.5 mg into the skin once a week.   Dulaglutide (TRULICITY) 3 VC/9.4WH SOPN Inject into the skin.   metoprolol succinate (TOPROL-XL) 25 MG 24 hr tablet Take 25 mg by mouth daily.   pioglitazone (ACTOS) 15 MG tablet Take 15 mg by mouth daily.   rosuvastatin (CRESTOR) 5 MG tablet Take 5 mg by mouth daily.   sacubitril-valsartan (ENTRESTO) 24-26 MG Take 1 tablet by mouth daily.   SYNJARDY XR 12.03-999 MG TB24 Take 2 tablets by mouth every morning.   testosterone cypionate (DEPOTESTOSTERONE CYPIONATE) 200 MG/ML injection Inject 200 mg into the muscle every 28 (twenty-eight) days.     Allergies:   Patient has no known allergies.   Social History   Socioeconomic History   Marital status: Married    Spouse name: Not on file   Number of children: 4   Years of education: Not on file   Highest education level: Not on file  Occupational History   Occupation: Physician  Tobacco Use   Smoking status: Never   Smokeless tobacco: Never  Vaping Use   Vaping Use: Never used  Substance and Sexual Activity   Alcohol use: Never   Drug  use: Never   Sexual activity: Not on file  Other Topics Concern   Not on file  Social History Narrative   Not on file   Social Determinants of Health   Financial Resource Strain: Not on file  Food Insecurity: Not on file  Transportation Needs: Not on file  Physical Activity: Not on file  Stress: Not on file  Social Connections: Not on file     Family History: The patient's family history includes Breast cancer in his cousin; CAD in his brother and father; Diabetes in his brother; Hypertension in his mother. There is no history of Colon cancer or Esophageal cancer. ROS:   Please see the history of present illness.    All other systems reviewed and are negative.  EKGs/Labs/Other Studies Reviewed:    The following studies were reviewed today:    Recent Labs: 02/22/2021: Hemoglobin 16.2; Magnesium 2.0; Platelets 211; TSH 2.182 04/04/2021: BUN 17; Creatinine, Ser 1.12; NT-Pro BNP 17; Potassium 4.4; Sodium 140  Recent Lipid Panel No results found for: CHOL, TRIG, HDL, CHOLHDL, VLDL, LDLCALC, LDLDIRECT  Physical Exam:  VS:  BP 98/66    Pulse 76    Ht 5\' 7"  (1.702 m)    Wt 164 lb (74.4 kg)    SpO2 99%    BMI 25.69 kg/m     Wt Readings from Last 3 Encounters:  11/07/21 164 lb (74.4 kg)  05/03/21 159 lb (72.1 kg)  04/04/21 158 lb (71.7 kg)     GEN: He looks markedly improved well nourished, well developed in no acute distress HEENT: Normal NECK: No JVD; No carotid bruits LYMPHATICS: No lymphadenopathy CARDIAC: RRR, no murmurs, rubs, gallops RESPIRATORY:  Clear to auscultation without rales, wheezing or rhonchi  ABDOMEN: Soft, non-tender, non-distended MUSCULOSKELETAL:  No edema; No deformity  SKIN: Warm and dry NEUROLOGIC:  Alert and oriented x 3 PSYCHIATRIC:  Normal affect    Signed, Shirlee More, MD  11/07/2021 3:40 PM    [facility]

## 2021-11-07 ENCOUNTER — Encounter: Payer: Self-pay | Admitting: Cardiology

## 2021-11-07 ENCOUNTER — Ambulatory Visit (INDEPENDENT_AMBULATORY_CARE_PROVIDER_SITE_OTHER): Payer: BC Managed Care – PPO | Admitting: Cardiology

## 2021-11-07 ENCOUNTER — Other Ambulatory Visit: Payer: Self-pay

## 2021-11-07 VITALS — BP 98/66 | HR 76 | Ht 67.0 in | Wt 164.0 lb

## 2021-11-07 DIAGNOSIS — Z7901 Long term (current) use of anticoagulants: Secondary | ICD-10-CM | POA: Diagnosis not present

## 2021-11-07 DIAGNOSIS — I447 Left bundle-branch block, unspecified: Secondary | ICD-10-CM

## 2021-11-07 DIAGNOSIS — I480 Paroxysmal atrial fibrillation: Secondary | ICD-10-CM

## 2021-11-07 DIAGNOSIS — Z950 Presence of cardiac pacemaker: Secondary | ICD-10-CM

## 2021-11-07 DIAGNOSIS — I428 Other cardiomyopathies: Secondary | ICD-10-CM

## 2021-11-07 DIAGNOSIS — I119 Hypertensive heart disease without heart failure: Secondary | ICD-10-CM

## 2021-11-07 MED ORDER — APIXABAN 5 MG PO TABS: 5.0000 mg | ORAL_TABLET | Freq: Two times a day (BID) | ORAL | 3 refills | Status: DC

## 2021-11-07 NOTE — Patient Instructions (Signed)

## 2021-12-10 DIAGNOSIS — R350 Frequency of micturition: Secondary | ICD-10-CM | POA: Diagnosis not present

## 2021-12-17 DIAGNOSIS — I480 Paroxysmal atrial fibrillation: Secondary | ICD-10-CM | POA: Diagnosis not present

## 2021-12-17 DIAGNOSIS — I495 Sick sinus syndrome: Secondary | ICD-10-CM | POA: Diagnosis not present

## 2021-12-17 DIAGNOSIS — Z950 Presence of cardiac pacemaker: Secondary | ICD-10-CM | POA: Diagnosis not present

## 2021-12-17 DIAGNOSIS — R0789 Other chest pain: Secondary | ICD-10-CM | POA: Diagnosis not present

## 2021-12-17 DIAGNOSIS — R9431 Abnormal electrocardiogram [ECG] [EKG]: Secondary | ICD-10-CM | POA: Diagnosis not present

## 2021-12-17 DIAGNOSIS — I447 Left bundle-branch block, unspecified: Secondary | ICD-10-CM | POA: Diagnosis not present

## 2021-12-21 DIAGNOSIS — Z45018 Encounter for adjustment and management of other part of cardiac pacemaker: Secondary | ICD-10-CM | POA: Diagnosis not present

## 2021-12-21 DIAGNOSIS — I480 Paroxysmal atrial fibrillation: Secondary | ICD-10-CM | POA: Diagnosis not present

## 2022-01-08 DIAGNOSIS — Z79899 Other long term (current) drug therapy: Secondary | ICD-10-CM | POA: Diagnosis not present

## 2022-01-08 DIAGNOSIS — E119 Type 2 diabetes mellitus without complications: Secondary | ICD-10-CM | POA: Diagnosis not present

## 2022-01-08 DIAGNOSIS — Z7901 Long term (current) use of anticoagulants: Secondary | ICD-10-CM | POA: Diagnosis not present

## 2022-01-08 DIAGNOSIS — Z7984 Long term (current) use of oral hypoglycemic drugs: Secondary | ICD-10-CM | POA: Diagnosis not present

## 2022-01-08 DIAGNOSIS — I480 Paroxysmal atrial fibrillation: Secondary | ICD-10-CM | POA: Diagnosis not present

## 2022-01-08 DIAGNOSIS — I509 Heart failure, unspecified: Secondary | ICD-10-CM | POA: Diagnosis not present

## 2022-01-08 DIAGNOSIS — Z7985 Long-term (current) use of injectable non-insulin antidiabetic drugs: Secondary | ICD-10-CM | POA: Diagnosis not present

## 2022-01-08 DIAGNOSIS — I110 Hypertensive heart disease with heart failure: Secondary | ICD-10-CM | POA: Diagnosis not present

## 2022-01-08 DIAGNOSIS — I420 Dilated cardiomyopathy: Secondary | ICD-10-CM | POA: Diagnosis not present

## 2022-01-08 DIAGNOSIS — E785 Hyperlipidemia, unspecified: Secondary | ICD-10-CM | POA: Diagnosis not present

## 2022-01-15 DIAGNOSIS — E785 Hyperlipidemia, unspecified: Secondary | ICD-10-CM | POA: Diagnosis not present

## 2022-01-15 DIAGNOSIS — E119 Type 2 diabetes mellitus without complications: Secondary | ICD-10-CM | POA: Diagnosis not present

## 2022-01-15 DIAGNOSIS — I420 Dilated cardiomyopathy: Secondary | ICD-10-CM | POA: Diagnosis not present

## 2022-01-15 DIAGNOSIS — I509 Heart failure, unspecified: Secondary | ICD-10-CM | POA: Diagnosis not present

## 2022-01-15 DIAGNOSIS — I110 Hypertensive heart disease with heart failure: Secondary | ICD-10-CM | POA: Diagnosis not present

## 2022-01-15 DIAGNOSIS — Z7901 Long term (current) use of anticoagulants: Secondary | ICD-10-CM | POA: Diagnosis not present

## 2022-01-15 DIAGNOSIS — Z79899 Other long term (current) drug therapy: Secondary | ICD-10-CM | POA: Diagnosis not present

## 2022-01-15 DIAGNOSIS — Z7985 Long-term (current) use of injectable non-insulin antidiabetic drugs: Secondary | ICD-10-CM | POA: Diagnosis not present

## 2022-01-15 DIAGNOSIS — Z7984 Long term (current) use of oral hypoglycemic drugs: Secondary | ICD-10-CM | POA: Diagnosis not present

## 2022-01-15 DIAGNOSIS — I428 Other cardiomyopathies: Secondary | ICD-10-CM | POA: Diagnosis not present

## 2022-01-15 DIAGNOSIS — I498 Other specified cardiac arrhythmias: Secondary | ICD-10-CM | POA: Diagnosis not present

## 2022-01-15 DIAGNOSIS — I4891 Unspecified atrial fibrillation: Secondary | ICD-10-CM | POA: Diagnosis not present

## 2022-01-15 DIAGNOSIS — I480 Paroxysmal atrial fibrillation: Secondary | ICD-10-CM | POA: Diagnosis not present

## 2022-02-06 ENCOUNTER — Other Ambulatory Visit: Payer: Self-pay

## 2022-02-06 ENCOUNTER — Encounter: Payer: Self-pay | Admitting: Cardiology

## 2022-02-06 ENCOUNTER — Ambulatory Visit: Payer: BC Managed Care – PPO | Admitting: Cardiology

## 2022-02-06 VITALS — BP 102/72 | HR 78 | Ht 67.0 in | Wt 169.0 lb

## 2022-02-06 DIAGNOSIS — I480 Paroxysmal atrial fibrillation: Secondary | ICD-10-CM | POA: Diagnosis not present

## 2022-02-06 DIAGNOSIS — Z7901 Long term (current) use of anticoagulants: Secondary | ICD-10-CM

## 2022-02-06 DIAGNOSIS — I447 Left bundle-branch block, unspecified: Secondary | ICD-10-CM

## 2022-02-06 DIAGNOSIS — I428 Other cardiomyopathies: Secondary | ICD-10-CM | POA: Diagnosis not present

## 2022-02-06 NOTE — Progress Notes (Signed)
?Cardiology Office Note:   ? ?Date:  02/06/2022  ? ?ID:  Cristian Brothers, [name], DOB [birth year], MRN [id number] ? ?PCP:  [name], [name]  ?Cardiologist:  Shirlee More, [name]   ? ?Referring [name]: [name], [name]  ? ? ?ASSESSMENT:   ? ?1. PAF (paroxysmal atrial fibrillation) ([place])   ?2. Chronic anticoagulation   ?3. LBBB (left bundle branch block)   ?4. Other cardiomyopathy ([place])   ? ?PLAN:   ? ?In order of problems listed above: ? ?He is markedly improved after his atrial fibrillation EP catheter ablation and physiologic bundle branch block pacing.  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  He also is on SGLT2 inhibitor ? ? ?Next appointment: 6 months ? ? ?Medication Adjustments/Labs and Tests Ordered: ?Current medicines are reviewed at length with the patient today.  Concerns regarding medicines are outlined above.  ?No orders of the defined types were placed in this encounter. ? ?No orders of the defined types were placed in this encounter. ? ? ?Chief Complaint  ?Patient presents with  ? Follow-up  ? Cardiomyopathy  ? Atrial Fibrillation  ? ? ?History of Present Illness:   ? ?Cristian Brothers, [name] is a 61 y.o. male with a hx of paroxysmal atrial fibrillation with previous cardioversion chronic anticoagulation left bundle branch block hypertensive heart disease without heart failure and cardiomyopathy initial ejection fraction of 40 to 45% last seen by me 11/07/2021  He had a dual-chamber pacemaker with a left bundle branch block area pacing implanted at [facility] 08/21/2021.  He had postoperative chest pain echocardiogram was described as showing normal ejection fraction 55 to 60% and normal pericardium without effusion.  He was synchronously  cardioverted from atrial fibrillation 07/31/2021 [place].  He was initially seen by me in consultation 07/11/2021 and subsequent care [place].   ?He  subsequently had EP catheter ablation of atrial fibrillation [facility] 01/15/2022 ?EKG 01/15/2022 [facility] atrial sensed ventricular paced rhythm ?Echocardiogram [facility] 08/22/2021 shows EF 55 to 60% normal right ventricular size and function. ? ?Compliance with diet, lifestyle and medications: Yes ? ?He is fully recovered no lightheadedness exercise intolerance shortness of breath chest pain or edema.  He had 1 episode of atrial fibrillation lasting for about half a day soon after his ablation.  He is hoping to get off his anticoagulant in the future. ?Past Medical History:  ?Diagnosis Date  ? Cardiac pacemaker in situ 08/22/2021  ? Diabetes ([place])   ? Dilated cardiomyopathy ([place]) 07/11/2021  ? Dyslipidemia   ? H. pylori infection   ? Treated in 2009 or 2010  ? Hardening of the aorta (main artery of the heart) ([place]) 02/28/2021  ? Hypertension   ? Hypertensive heart disease   ? LBBB (left bundle branch block) 07/11/2021  ? Paroxysmal atrial fibrillation ([place]) 02/22/2021  ? Prostatitis   ? Pure hypercholesterolemia 11/01/2021  ? Sinus node dysfunction ([place]) 07/11/2021  ? Formatting of this note might be different from the original. Added automatically from request for surgery [id number]  ? Travel advice encounter 12/15/2018  ? ? ?Past Surgical History:  ?Procedure Laterality Date  ? CARDIOVERSION N/A 02/23/2021  ? Procedure: CARDIOVERSION;  Surgeon: Buford Dresser, [name];  Location: [place];  Service: Cardiovascular;  Laterality:  N/A;  ? CATARACT EXTRACTION, BILATERAL    ? COLONOSCOPY    ? around 2009 or 2010. Was normal. [place]  ? ESOPHAGOGASTRODUODENOSCOPY    ? Around 2009 or 2010. [place] Treated for H pylori  ? NOSE SURGERY    ? TEE WITHOUT CARDIOVERSION N/A 02/23/2021  ? Procedure:  TRANSESOPHAGEAL ECHOCARDIOGRAM (TEE);  Surgeon: Buford Dresser, [name];  Location: [place];  Service: Cardiovascular;  Laterality: N/A;  ? ? ?Current Medications: ?Current Meds  ?Medication Sig  ? apixaban (ELIQUIS) 5 MG TABS tablet Take 1 tablet (5 mg total) by mouth 2 (two) times daily.  ? Cholecalciferol (VITAMIN D) 125 MCG (5000 UT) CAPS Take 5,000 Units by mouth once a week.  ? Dulaglutide (TRULICITY) 1.5 ZC/5.8IF SOPN Inject 1.5 mg into the skin once a week.  ? metoprolol succinate (TOPROL-XL) 25 MG 24 hr tablet Take 25 mg by mouth daily.  ? pioglitazone (ACTOS) 15 MG tablet Take 15 mg by mouth daily.  ? rosuvastatin (CRESTOR) 5 MG tablet Take 5 mg by mouth daily.  ? sacubitril-valsartan (ENTRESTO) 24-26 MG Take 1 tablet by mouth daily.  ? SYNJARDY XR 12.03-999 MG TB24 Take 2 tablets by mouth every morning.  ? testosterone cypionate (DEPOTESTOSTERONE CYPIONATE) 200 MG/ML injection Inject 200 mg into the muscle every 28 (twenty-eight) days.  ?  ? ?Allergies:   Patient has no known allergies.  ? ?Social History  ? ?Socioeconomic History  ? Marital status: Married  ?  Spouse name: Not on file  ? Number of children: 4  ? Years of education: Not on file  ? Highest education level: Not on file  ?Occupational History  ? Occupation: Physician  ?Tobacco Use  ? Smoking status: Never  ? Smokeless tobacco: Never  ?Vaping Use  ? Vaping Use: Never used  ?Substance and Sexual Activity  ? Alcohol use: Never  ? Drug use: Never  ? Sexual activity: Not on file  ?Other Topics Concern  ? Not on file  ?Social History Narrative  ? Not on file  ? ?Social Determinants of Health  ? ?Financial Resource Strain: Not on file  ?Food Insecurity: Not on file  ?Transportation Needs: Not on file  ?Physical Activity: Not on file  ?Stress: Not on file  ?Social Connections: Not on file  ?  ? ?Family History: ?The patient's family history includes Breast cancer in his cousin; CAD in his brother and father; Diabetes in his brother;  Hypertension in his mother. There is no history of Colon cancer or Esophageal cancer. ?ROS:   ?Please see the history of present illness.    ?All other systems reviewed and are negative. ? ?EKGs/Labs/Other Studies Reviewed:   ? ?The following studies were reviewed today: ? ?Recent Labs: ?02/22/2021: Hemoglobin 16.2; Magnesium 2.0; Platelets 211; TSH 2.182 ?04/04/2021: BUN 17; Creatinine, Ser 1.12; NT-Pro BNP 17; Potassium 4.4; Sodium 140  ?Chart 01/08/2022 [facility] 0 ?3 7 BMP normal potassium 4.7 creatinine 1.14 GFR 74 cc hemoglobin 14.9 platelets 191,000 ? ?Physical Exam:   ? ?VS:  BP 102/72 (BP Location: Right Arm, Patient Position: Sitting)   Pulse 78   Ht '5\' 7"'$  (1.702 m)   Wt 169 lb (76.7 kg)   SpO2 98%   BMI 26.47 kg/m?    ? ?Wt Readings from Last 3 Encounters:  ?02/06/22 169 lb (76.7 kg)  ?11/07/21 164 lb (74.4 kg)  ?05/03/21 159 lb (72.1 kg)  ?  ? ?GEN: Looks healthy well nourished, well developed in no acute  distress ?HEENT: Normal ?NECK: No JVD; No carotid bruits ?LYMPHATICS: No lymphadenopathy ?CARDIAC: RRR, no murmurs, rubs, gallops ?RESPIRATORY:  Clear to auscultation without rales, wheezing or rhonchi  ?ABDOMEN: Soft, non-tender, non-distended ?MUSCULOSKELETAL:  No edema; No deformity  ?SKIN: Warm and dry ?NEUROLOGIC:  Alert and oriented x 3 ?PSYCHIATRIC:  Normal affect  ? ? ?Signed, ?Shirlee More, [name]  ?02/06/2022 2:17 PM    ?[place]  ?

## 2022-02-06 NOTE — Patient Instructions (Signed)
Medication Instructions:  ?Your physician recommends that you continue on your current medications as directed. Please refer to the Current Medication list given to you today. ? ?*If you need a refill on your cardiac medications before your next appointment, please call your pharmacy* ? ? ?Lab Work: ?None ?If you have labs (blood work) drawn today and your tests are completely normal, you will receive your results only by: ?MyChart Message (if you have MyChart) OR ?A paper copy in the mail ?If you have any lab test that is abnormal or we need to change your treatment, we will call you to review the results. ? ? ?Testing/Procedures: ?None ? ? ?Follow-Up: ?At [facility], you and your health needs are our priority.  As part of our continuing mission to provide you with exceptional heart care, we have created designated Provider Care Teams.  These Care Teams include your primary Cardiologist (physician) and Advanced Practice Providers (APPs -  Physician Assistants and Nurse Practitioners) who all work together to provide you with the care you need, when you need it. ? ?We recommend signing up for the patient portal called "MyChart".  Sign up information is provided on this After Visit Summary.  MyChart is used to connect with patients for Virtual Visits (Telemedicine).  Patients are able to view lab/test results, encounter notes, upcoming appointments, etc.  Non-urgent messages can be sent to your provider as well.   ?To learn more about what you can do with MyChart, go to [email or web address].   ? ?Your next appointment:   ?6 month(s) ? ?The format for your next appointment:   ?In Person ? ?Provider:   ?Shirlee More, MD  ? ? ?Other Instructions ?None ? ?

## 2022-02-27 DIAGNOSIS — I495 Sick sinus syndrome: Secondary | ICD-10-CM | POA: Diagnosis not present

## 2022-02-27 DIAGNOSIS — I499 Cardiac arrhythmia, unspecified: Secondary | ICD-10-CM | POA: Diagnosis not present

## 2022-02-27 DIAGNOSIS — Z4502 Encounter for adjustment and management of automatic implantable cardiac defibrillator: Secondary | ICD-10-CM | POA: Diagnosis not present

## 2022-02-27 DIAGNOSIS — I10 Essential (primary) hypertension: Secondary | ICD-10-CM | POA: Diagnosis not present

## 2022-02-27 DIAGNOSIS — Z950 Presence of cardiac pacemaker: Secondary | ICD-10-CM | POA: Diagnosis not present

## 2022-02-27 DIAGNOSIS — E785 Hyperlipidemia, unspecified: Secondary | ICD-10-CM | POA: Diagnosis not present

## 2022-02-27 DIAGNOSIS — I447 Left bundle-branch block, unspecified: Secondary | ICD-10-CM | POA: Diagnosis not present

## 2022-02-27 DIAGNOSIS — Z9889 Other specified postprocedural states: Secondary | ICD-10-CM | POA: Diagnosis not present

## 2022-02-27 DIAGNOSIS — I4891 Unspecified atrial fibrillation: Secondary | ICD-10-CM | POA: Diagnosis not present

## 2022-02-27 DIAGNOSIS — E119 Type 2 diabetes mellitus without complications: Secondary | ICD-10-CM | POA: Diagnosis not present

## 2022-02-27 DIAGNOSIS — I428 Other cardiomyopathies: Secondary | ICD-10-CM | POA: Diagnosis not present

## 2022-02-27 DIAGNOSIS — Z7901 Long term (current) use of anticoagulants: Secondary | ICD-10-CM | POA: Diagnosis not present

## 2022-03-18 DIAGNOSIS — Z45010 Encounter for checking and testing of cardiac pacemaker pulse generator [battery]: Secondary | ICD-10-CM | POA: Diagnosis not present

## 2022-03-18 DIAGNOSIS — I495 Sick sinus syndrome: Secondary | ICD-10-CM | POA: Diagnosis not present

## 2022-03-20 DIAGNOSIS — Z45018 Encounter for adjustment and management of other part of cardiac pacemaker: Secondary | ICD-10-CM | POA: Diagnosis not present

## 2022-04-18 DIAGNOSIS — E1169 Type 2 diabetes mellitus with other specified complication: Secondary | ICD-10-CM | POA: Diagnosis not present

## 2022-04-18 DIAGNOSIS — I10 Essential (primary) hypertension: Secondary | ICD-10-CM | POA: Diagnosis not present

## 2022-04-18 DIAGNOSIS — I700 Atherosclerosis of aorta: Secondary | ICD-10-CM | POA: Diagnosis not present

## 2022-04-18 DIAGNOSIS — I480 Paroxysmal atrial fibrillation: Secondary | ICD-10-CM | POA: Diagnosis not present

## 2022-06-17 DIAGNOSIS — I480 Paroxysmal atrial fibrillation: Secondary | ICD-10-CM | POA: Diagnosis not present

## 2022-06-17 DIAGNOSIS — Z45018 Encounter for adjustment and management of other part of cardiac pacemaker: Secondary | ICD-10-CM | POA: Diagnosis not present

## 2022-06-23 DIAGNOSIS — Z45018 Encounter for adjustment and management of other part of cardiac pacemaker: Secondary | ICD-10-CM | POA: Diagnosis not present

## 2022-06-23 DIAGNOSIS — I480 Paroxysmal atrial fibrillation: Secondary | ICD-10-CM | POA: Diagnosis not present

## 2022-08-01 DIAGNOSIS — H35373 Puckering of macula, bilateral: Secondary | ICD-10-CM | POA: Diagnosis not present

## 2022-08-01 DIAGNOSIS — H524 Presbyopia: Secondary | ICD-10-CM | POA: Diagnosis not present

## 2022-08-01 DIAGNOSIS — H43393 Other vitreous opacities, bilateral: Secondary | ICD-10-CM | POA: Diagnosis not present

## 2022-08-01 DIAGNOSIS — E119 Type 2 diabetes mellitus without complications: Secondary | ICD-10-CM | POA: Diagnosis not present

## 2022-08-01 DIAGNOSIS — H04123 Dry eye syndrome of bilateral lacrimal glands: Secondary | ICD-10-CM | POA: Diagnosis not present

## 2022-09-11 DIAGNOSIS — I499 Cardiac arrhythmia, unspecified: Secondary | ICD-10-CM | POA: Diagnosis not present

## 2022-09-11 DIAGNOSIS — I447 Left bundle-branch block, unspecified: Secondary | ICD-10-CM | POA: Diagnosis not present

## 2022-09-11 DIAGNOSIS — I420 Dilated cardiomyopathy: Secondary | ICD-10-CM | POA: Diagnosis not present

## 2022-09-11 DIAGNOSIS — Z950 Presence of cardiac pacemaker: Secondary | ICD-10-CM | POA: Diagnosis not present

## 2022-09-11 DIAGNOSIS — Z7901 Long term (current) use of anticoagulants: Secondary | ICD-10-CM | POA: Diagnosis not present

## 2022-09-11 DIAGNOSIS — I4891 Unspecified atrial fibrillation: Secondary | ICD-10-CM | POA: Diagnosis not present

## 2022-09-11 DIAGNOSIS — I495 Sick sinus syndrome: Secondary | ICD-10-CM | POA: Diagnosis not present

## 2022-09-13 ENCOUNTER — Encounter: Payer: Self-pay | Admitting: Gastroenterology

## 2022-09-14 DIAGNOSIS — Z45018 Encounter for adjustment and management of other part of cardiac pacemaker: Secondary | ICD-10-CM | POA: Diagnosis not present

## 2022-09-14 DIAGNOSIS — I480 Paroxysmal atrial fibrillation: Secondary | ICD-10-CM | POA: Diagnosis not present

## 2022-09-16 DIAGNOSIS — I480 Paroxysmal atrial fibrillation: Secondary | ICD-10-CM | POA: Diagnosis not present

## 2022-09-16 DIAGNOSIS — Z45018 Encounter for adjustment and management of other part of cardiac pacemaker: Secondary | ICD-10-CM | POA: Diagnosis not present

## 2022-09-23 DIAGNOSIS — Z45018 Encounter for adjustment and management of other part of cardiac pacemaker: Secondary | ICD-10-CM | POA: Diagnosis not present

## 2022-09-23 DIAGNOSIS — I480 Paroxysmal atrial fibrillation: Secondary | ICD-10-CM | POA: Diagnosis not present

## 2022-10-07 ENCOUNTER — Other Ambulatory Visit: Payer: Self-pay | Admitting: Cardiology

## 2022-10-08 NOTE — Telephone Encounter (Signed)
Refills sent to pharmacy. 

## 2022-10-09 ENCOUNTER — Other Ambulatory Visit: Payer: Self-pay

## 2022-10-09 MED ORDER — ENTRESTO 24-26 MG PO TABS: 1.0000 | ORAL_TABLET | Freq: Two times a day (BID) | ORAL | 3 refills | Status: DC

## 2022-10-23 ENCOUNTER — Encounter: Payer: Self-pay | Admitting: Cardiology

## 2022-10-23 ENCOUNTER — Ambulatory Visit: Payer: BC Managed Care – PPO | Attending: Cardiology | Admitting: Cardiology

## 2022-10-23 VITALS — BP 130/80 | HR 60 | Ht 67.0 in | Wt 163.0 lb

## 2022-10-23 DIAGNOSIS — I447 Left bundle-branch block, unspecified: Secondary | ICD-10-CM | POA: Diagnosis not present

## 2022-10-23 DIAGNOSIS — I119 Hypertensive heart disease without heart failure: Secondary | ICD-10-CM

## 2022-10-23 DIAGNOSIS — Z8679 Personal history of other diseases of the circulatory system: Secondary | ICD-10-CM

## 2022-10-23 DIAGNOSIS — I428 Other cardiomyopathies: Secondary | ICD-10-CM

## 2022-10-23 DIAGNOSIS — Z9889 Other specified postprocedural states: Secondary | ICD-10-CM

## 2022-10-23 DIAGNOSIS — Z950 Presence of cardiac pacemaker: Secondary | ICD-10-CM

## 2022-10-23 DIAGNOSIS — I480 Paroxysmal atrial fibrillation: Secondary | ICD-10-CM

## 2022-10-23 NOTE — Progress Notes (Signed)
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 LBBB (left bundle branch block)   2. Other cardiomyopathy ([place])   3. Hypertensive heart disease without heart failure   4. PAF (paroxysmal atrial fibrillation) ([place])   5. S/P ablation of atrial fibrillation   6. 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  I told him I do not think is unreasonable to withdraw anticoagulant therapy in the future Stable Entresto discontinued with symptomatic bradycardia.  We decided to do an echocardiogram and if his EF remains normal to defer vasodilator therapy at this time   Next appointment: 6 months   Medication Adjustments/Labs and Tests Ordered: Current medicines are reviewed at length with the patient today.  Concerns regarding medicines are outlined above.  Orders Placed This Encounter  Procedures   EKG 12-Lead   No orders of the defined types were placed in this encounter.   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   Echocardiogram performed at [facility] October 2022 showed normal ejection fraction 55 to 60%.  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 Past Medical History:  Diagnosis Date   Cardiac pacemaker in situ 08/22/2021   Diabetes ([place])    Dilated cardiomyopathy ([place]) 07/11/2021   Dyslipidemia    H. pylori infection    Treated in 2009 or 2010   Hardening of the aorta (main artery of the heart) ([place]) 02/28/2021   Hypertension    Hypertensive heart disease    LBBB (left bundle branch block) 07/11/2021   Paroxysmal atrial fibrillation ([place]) 02/22/2021   Prostatitis    Pure hypercholesterolemia 11/01/2021   Sinus node dysfunction ([place]) 07/11/2021   Formatting of this note might be different from the original. Added automatically from request for surgery [id number]   Travel advice encounter 12/15/2018    Past Surgical History:  Procedure Laterality Date   CARDIOVERSION N/A 02/23/2021   Procedure: CARDIOVERSION;  Surgeon: Buford Dresser, MD;  Location: [facility]  ENDOSCOPY;  Service: Cardiovascular;  Laterality: N/A;   CATARACT EXTRACTION, BILATERAL     COLONOSCOPY     around 2009 or 2010. Was normal. [place]   ESOPHAGOGASTRODUODENOSCOPY     Around 2009 or 2010. [place] Treated for H pylori   NOSE SURGERY     TEE WITHOUT CARDIOVERSION N/A 02/23/2021   Procedure: TRANSESOPHAGEAL ECHOCARDIOGRAM (TEE);  Surgeon: Buford Dresser, MD;  Location: [facility] ENDOSCOPY;  Service: Cardiovascular;  Laterality: N/A;    Current Medications: Current Meds  Medication Sig   apixaban (ELIQUIS) 5 MG TABS tablet Take 1 tablet (5 mg total) by mouth 2 (two) times daily.   Cholecalciferol (VITAMIN D) 125 MCG (5000 UT) CAPS Take 5,000 Units by mouth once a week.   Dulaglutide (TRULICITY) 1.5 WV/3.7TG SOPN Inject 1.5 mg into the skin once a week.   metoprolol succinate (TOPROL-XL) 25 MG 24 hr tablet Take 25 mg by mouth daily.   pioglitazone (ACTOS) 15 MG tablet Take 15 mg by mouth daily.   rosuvastatin (CRESTOR) 5 MG tablet Take 5 mg by mouth daily.   SYNJARDY XR 12.03-999 MG TB24 Take 2 tablets by mouth every morning.   testosterone cypionate (DEPOTESTOSTERONE CYPIONATE) 200 MG/ML injection Inject 200 mg into the muscle every 28 (twenty-eight) days.     Allergies:   Patient has no known allergies.   Social History   Socioeconomic History   Marital status: Married    Spouse name: Not on file   Number of children: 4   Years of education: Not on file   Highest education level: Not on file  Occupational History   Occupation: Physician  Tobacco Use   Smoking status: Never   Smokeless tobacco: Never  Vaping Use   Vaping Use: Never used  Substance and Sexual Activity   Alcohol use: Never   Drug use: Never   Sexual activity: Not on file  Other Topics Concern   Not on file  Social History Narrative   Not on file   Social Determinants of Health   Financial Resource Strain: Not on file  Food Insecurity: Not on file  Transportation Needs: Not on file  Physical Activity: Not on file  Stress: Not on file  Social Connections: Not on file     Family History: The patient's family history includes Breast cancer in his cousin; CAD in his brother and father; Diabetes in his brother; Hypertension in his mother. There is no history of Colon cancer or Esophageal cancer. ROS:   Please see the history of present illness.     All other systems reviewed and are negative.  EKGs/Labs/Other Studies Reviewed:    The following studies were reviewed today:  EKG:  EKG ordered today and personally reviewed.  The ekg ordered today demonstrates sinus rhythm AV paced rhythm  Recent Labs: 09/13/2021 cholesterol 97 LDL 35 A1c 6.70601 2023 creatinine 1.23 potassium 4.5  Physical Exam:    VS:  BP 130/80 (BP Location: Right Arm, Patient Position: Sitting, Cuff Size: Normal)   Pulse 60   Ht '5\' 7"'$  (1.702 m)   Wt 163 lb (73.9 kg)   SpO2 97%   BMI 25.53 kg/m     Wt Readings from Last 3 Encounters:  10/23/22 163 lb (73.9 kg)  02/06/22 169 lb (76.7 kg)  11/07/21 164 lb (74.4 kg)     GEN:  Well nourished, well developed in no acute distress HEENT: Normal NECK: No JVD; No carotid bruits LYMPHATICS: No lymphadenopathy CARDIAC: RRR, no murmurs,  rubs, gallops RESPIRATORY:  Clear to auscultation without rales, wheezing or rhonchi  ABDOMEN: Soft, non-tender, non-distended MUSCULOSKELETAL:  No edema; No deformity  SKIN: Warm and dry NEUROLOGIC:  Alert and oriented x 3 PSYCHIATRIC:  Normal affect    Signed, Shirlee More, MD  10/23/2022 2:43 PM    [place]

## 2022-10-23 NOTE — Addendum Note (Signed)
Addended by: Edwyna Shell I on: 10/23/2022 03:07 PM   Modules accepted: Orders

## 2022-10-23 NOTE — Patient Instructions (Signed)
Medication Instructions:  Your physician recommends that you continue on your current medications as directed. Please refer to the Current Medication list given to you today.  *If you need a refill on your cardiac medications before your next appointment, please call your pharmacy*   Lab Work: None If you have labs (blood work) drawn today and your tests are completely normal, you will receive your results only by: MyChart Message (if you have MyChart) OR A paper copy in the mail If you have any lab test that is abnormal or we need to change your treatment, we will call you to review the results.   Testing/Procedures: Your physician has requested that you have an echocardiogram. Echocardiography is a painless test that uses sound waves to create images of your heart. It provides your doctor with information about the size and shape of your heart and how well your heart's chambers and valves are working. This procedure takes approximately one hour. There are no restrictions for this procedure. Please do NOT wear cologne, perfume, aftershave, or lotions (deodorant is allowed). Please arrive 15 minutes prior to your appointment time.    Follow-Up: At [facility], you and your health needs are our priority.  As part of our continuing mission to provide you with exceptional heart care, we have created designated Provider Care Teams.  These Care Teams include your primary Cardiologist (physician) and Advanced Practice Providers (APPs -  Physician Assistants and Nurse Practitioners) who all work together to provide you with the care you need, when you need it.  We recommend signing up for the patient portal called "MyChart".  Sign up information is provided on this After Visit Summary.  MyChart is used to connect with patients for Virtual Visits (Telemedicine).  Patients are able to view lab/test results, encounter notes, upcoming appointments, etc.  Non-urgent messages can be sent to your  provider as well.   To learn more about what you can do with MyChart, go to [email or web address]://[email or web address].com.    Your next appointment:   6 month(s)  The format for your next appointment:   In Person  Provider:   Brian Munley, MD    Other Instructions None  Important Information About Sugar       

## 2022-10-29 ENCOUNTER — Other Ambulatory Visit: Payer: Self-pay | Admitting: Cardiology

## 2022-10-29 NOTE — Telephone Encounter (Signed)
Prescription refill request for Eliquis received. Indication: PAF Last office visit: 10/23/22 Rinaldo Cloud MD Scr: 1.14 on 01/08/22 Age: 61 Weight: 73.9kg  Based on above findings Eliquis '5mg'$  twice daily is the appropriate dose,  Refill approved.

## 2022-11-06 ENCOUNTER — Ambulatory Visit: Payer: BC Managed Care – PPO

## 2022-11-21 ENCOUNTER — Encounter: Payer: Self-pay | Admitting: Cardiology

## 2022-11-21 ENCOUNTER — Ambulatory Visit: Payer: BC Managed Care – PPO | Attending: Cardiology

## 2022-11-21 DIAGNOSIS — I480 Paroxysmal atrial fibrillation: Secondary | ICD-10-CM

## 2022-11-21 DIAGNOSIS — Z8679 Personal history of other diseases of the circulatory system: Secondary | ICD-10-CM

## 2022-11-21 DIAGNOSIS — I447 Left bundle-branch block, unspecified: Secondary | ICD-10-CM | POA: Diagnosis not present

## 2022-11-21 DIAGNOSIS — I119 Hypertensive heart disease without heart failure: Secondary | ICD-10-CM

## 2022-11-21 DIAGNOSIS — Z950 Presence of cardiac pacemaker: Secondary | ICD-10-CM

## 2022-11-21 DIAGNOSIS — Z9889 Other specified postprocedural states: Secondary | ICD-10-CM

## 2022-11-21 DIAGNOSIS — I428 Other cardiomyopathies: Secondary | ICD-10-CM

## 2022-11-21 LAB — ECHOCARDIOGRAM COMPLETE
Area-P 1/2: 4.26 cm2
Calc EF: 48 %
S' Lateral: 3.3 cm
Single Plane A2C EF: 48.7 %
Single Plane A4C EF: 47.3 %

## 2022-12-11 ENCOUNTER — Other Ambulatory Visit: Payer: Self-pay

## 2022-12-11 MED ORDER — TRULICITY 1.5 MG/0.5ML ~~LOC~~ SOAJ: 1.5000 mg | SUBCUTANEOUS | 3 refills | Status: DC

## 2022-12-11 MED ORDER — APIXABAN 5 MG PO TABS: ORAL_TABLET | ORAL | 3 refills | Status: DC

## 2022-12-11 MED ORDER — ENTRESTO 24-26 MG PO TABS: 1.0000 | ORAL_TABLET | Freq: Two times a day (BID) | ORAL | 3 refills | Status: DC

## 2022-12-11 MED ORDER — METOPROLOL SUCCINATE ER 25 MG PO TB24: 25.0000 mg | ORAL_TABLET | Freq: Every day | ORAL | 3 refills | Status: DC

## 2022-12-18 ENCOUNTER — Other Ambulatory Visit: Payer: Self-pay

## 2022-12-18 MED ORDER — ONDANSETRON HCL 4 MG PO TABS: 4.0000 mg | ORAL_TABLET | Freq: Four times a day (QID) | ORAL | 1 refills | Status: DC | PRN

## 2022-12-18 MED ORDER — PAXLOVID (300/100) 20 X 150 MG & 10 X 100MG PO TBPK: ORAL_TABLET | ORAL | 0 refills | Status: DC

## 2023-01-17 ENCOUNTER — Other Ambulatory Visit: Payer: Self-pay

## 2023-01-17 MED ORDER — SYNJARDY XR 12.5-1000 MG PO TB24: 2.0000 | ORAL_TABLET | Freq: Every morning | ORAL | 3 refills | Status: DC

## 2023-01-17 MED ORDER — TRULICITY 1.5 MG/0.5ML ~~LOC~~ SOAJ: 1.5000 mg | SUBCUTANEOUS | 3 refills | Status: DC

## 2023-01-31 ENCOUNTER — Other Ambulatory Visit: Payer: Self-pay

## 2023-01-31 ENCOUNTER — Other Ambulatory Visit: Payer: Self-pay | Admitting: Internal Medicine

## 2023-01-31 MED ORDER — NOVOLOG FLEXPEN 100 UNIT/ML ~~LOC~~ SOPN: 7.0000 [IU] | PEN_INJECTOR | Freq: Four times a day (QID) | SUBCUTANEOUS | 0 refills | Status: DC

## 2023-01-31 MED ORDER — MODAFINIL 200 MG PO TABS: 200.0000 mg | ORAL_TABLET | Freq: Every day | ORAL | 0 refills | Status: DC

## 2023-01-31 NOTE — Telephone Encounter (Signed)
This encounter was created in error - please disregard.

## 2023-03-11 ENCOUNTER — Other Ambulatory Visit: Payer: Self-pay

## 2023-03-11 MED ORDER — MODAFINIL 200 MG PO TABS: 200.0000 mg | ORAL_TABLET | Freq: Every day | ORAL | 5 refills | Status: DC

## 2023-03-13 ENCOUNTER — Other Ambulatory Visit: Payer: Self-pay

## 2023-03-13 DIAGNOSIS — I495 Sick sinus syndrome: Secondary | ICD-10-CM | POA: Diagnosis not present

## 2023-03-13 MED ORDER — MODAFINIL 200 MG PO TABS: 200.0000 mg | ORAL_TABLET | Freq: Every day | ORAL | 5 refills | Status: DC

## 2023-03-14 ENCOUNTER — Other Ambulatory Visit: Payer: Self-pay | Admitting: Internal Medicine

## 2023-03-14 MED ORDER — MODAFINIL 200 MG PO TABS: 200.0000 mg | ORAL_TABLET | Freq: Every day | ORAL | 5 refills | Status: DC

## 2023-03-17 ENCOUNTER — Other Ambulatory Visit: Payer: Self-pay | Admitting: Internal Medicine

## 2023-03-17 MED ORDER — MODAFINIL 200 MG PO TABS: 200.0000 mg | ORAL_TABLET | Freq: Every day | ORAL | 5 refills | Status: DC

## 2023-04-22 ENCOUNTER — Other Ambulatory Visit: Payer: Self-pay

## 2023-04-22 MED ORDER — ROSUVASTATIN CALCIUM 5 MG PO TABS: 5.0000 mg | ORAL_TABLET | Freq: Every day | ORAL | 3 refills | Status: DC

## 2023-05-01 ENCOUNTER — Encounter: Payer: Self-pay | Admitting: Gastroenterology

## 2023-05-02 ENCOUNTER — Other Ambulatory Visit (HOSPITAL_COMMUNITY): Payer: Self-pay

## 2023-05-02 ENCOUNTER — Other Ambulatory Visit: Payer: Self-pay

## 2023-05-02 MED ORDER — TRULICITY 1.5 MG/0.5ML ~~LOC~~ SOAJ
1.5000 mg | SUBCUTANEOUS | 3 refills | Status: DC
  Filled 2023-05-02 (×2): qty 2, 28d supply, fill #0

## 2023-05-29 ENCOUNTER — Telehealth: Payer: Self-pay

## 2023-05-29 NOTE — Telephone Encounter (Signed)
Spoke with patient about current medications. Patient stated that he was not taking Eliquis or NovaLog.  His physician discontinued both medications 1 month prior.  Noted on pre chart paperwork for upcoming pre visit on 06/04/23.

## 2023-06-04 ENCOUNTER — Encounter: Payer: Self-pay | Admitting: Gastroenterology

## 2023-06-04 ENCOUNTER — Ambulatory Visit (AMBULATORY_SURGERY_CENTER): Payer: BC Managed Care – PPO

## 2023-06-04 VITALS — Ht 67.0 in | Wt 157.0 lb

## 2023-06-04 DIAGNOSIS — Z86010 Personal history of colonic polyps: Secondary | ICD-10-CM

## 2023-06-04 MED ORDER — NA SULFATE-K SULFATE-MG SULF 17.5-3.13-1.6 GM/177ML PO SOLN
1.0000 | Freq: Once | ORAL | 0 refills | Status: AC
Start: 2023-06-04 — End: 2023-06-04

## 2023-06-04 NOTE — Progress Notes (Signed)
No egg or soy allergy known to patient  No issues known to pt with past sedation with any surgeries or procedures Patient denies ever being told they had issues or difficulty with intubation  No FH of Malignant Hyperthermia Pt is not on diet pills Pt is not on  home 02  Pt is not on blood thinners  Pt denies issues with constipation  No A fib or A flutter Have any cardiac testing pending--no Pt instructed to use Singlecare.com or GoodRx for a price reduction on prep  Cam ambulate indepenently

## 2023-06-12 DIAGNOSIS — I495 Sick sinus syndrome: Secondary | ICD-10-CM | POA: Diagnosis not present

## 2023-06-18 ENCOUNTER — Encounter: Payer: Self-pay | Admitting: Gastroenterology

## 2023-06-18 ENCOUNTER — Ambulatory Visit (AMBULATORY_SURGERY_CENTER): Payer: BC Managed Care – PPO | Admitting: Gastroenterology

## 2023-06-18 VITALS — BP 96/48 | HR 69 | Temp 97.8°F | Resp 12 | Ht 67.0 in

## 2023-06-18 DIAGNOSIS — D128 Benign neoplasm of rectum: Secondary | ICD-10-CM | POA: Diagnosis not present

## 2023-06-18 DIAGNOSIS — D122 Benign neoplasm of ascending colon: Secondary | ICD-10-CM | POA: Diagnosis not present

## 2023-06-18 DIAGNOSIS — Z09 Encounter for follow-up examination after completed treatment for conditions other than malignant neoplasm: Secondary | ICD-10-CM | POA: Diagnosis not present

## 2023-06-18 DIAGNOSIS — Z86010 Personal history of colonic polyps: Secondary | ICD-10-CM

## 2023-06-18 DIAGNOSIS — D123 Benign neoplasm of transverse colon: Secondary | ICD-10-CM | POA: Diagnosis not present

## 2023-06-18 DIAGNOSIS — Z1211 Encounter for screening for malignant neoplasm of colon: Secondary | ICD-10-CM | POA: Diagnosis not present

## 2023-06-18 DIAGNOSIS — K635 Polyp of colon: Secondary | ICD-10-CM | POA: Diagnosis not present

## 2023-06-18 MED ORDER — SODIUM CHLORIDE 0.9 % IV SOLN: 500.0000 mL | Freq: Once | INTRAVENOUS | Status: DC

## 2023-06-18 NOTE — Op Note (Signed)
[facility] Patient Name: [name] Procedure Date: 06/18/2023 8:45 AM MRN: [id number] Endoscopist: Lynann Bologna , MD, [id number] Age: 62 Referring MD:  Date of Birth: 05/20/1961 Gender: Male Account #: [id number] Procedure:                Colonoscopy Indications:              High risk colon cancer surveillance: Personal                            history of colonic polyps Medicines:                Monitored Anesthesia Care Procedure:                Pre-Anesthesia Assessment:                           - Prior to the procedure, a History and Physical                            was performed, and patient medications and                            allergies were reviewed. The patient's tolerance of                            previous anesthesia was also reviewed. The risks                            and benefits of the procedure and the sedation                            options and risks were discussed with the patient.                            All questions were answered, and informed consent                            was obtained. Prior Anticoagulants: The patient has                            taken no anticoagulant or antiplatelet agents. ASA                            Grade Assessment: II - A patient with mild systemic                            disease. After reviewing the risks and benefits,                            the patient was deemed in satisfactory condition to                            undergo the procedure.  After obtaining informed consent, the colonoscope                            was passed under direct vision. Throughout the                            procedure, the patient's blood pressure, pulse, and                            oxygen saturations were monitored continuously. The                            Olympus PCF-H190DL (#[id number]) Colonoscope was                            introduced through the anus and advanced to  the the                            cecum, identified by appendiceal orifice and                            ileocecal valve. The colonoscopy was performed                            without difficulty. The patient tolerated the                            procedure well. The quality of the bowel                            preparation was good. The ileocecal valve,                            appendiceal orifice, and rectum were photographed. Scope In: 9:04:48 AM Scope Out: 9:28:16 AM Scope Withdrawal Time: 0 hours 20 minutes 3 seconds  Total Procedure Duration: 0 hours 23 minutes 28 seconds  Findings:                 A 15 mm polyp was found in the rectum, just above                            the dentate line the polyp was semi-sessile. The                            polyp was removed with a hot snare in one piece,                            retrieved by a [employer]. Resection and retrieval                            were complete. Post polypectomy photodocumentation                            was obtained.  Two sessile polyps were found in the proximal                            ascending colon. The polyps were 6 mm in size.                            These polyps were removed with a cold snare.                            Resection and retrieval were complete.                           Two sessile polyps were found in the mid transverse                            colon. The polyps were 4 to 6 mm in size. These                            polyps were removed with a cold snare. Resection                            and retrieval were complete.                           A few small-mouthed diverticula were found in the                            sigmoid colon.                           Non-bleeding internal hemorrhoids were found during                            retroflexion. The hemorrhoids were Grade I                            (internal hemorrhoids that do not  prolapse).                           The exam was otherwise without abnormality on                            direct and retroflexion views. Complications:            No immediate complications. Estimated Blood Loss:     Estimated blood loss: none. Impression:               - One 15 mm polyp in the rectum, removed with a hot                            snare. Resected and retrieved.                           - Two 6 mm polyps in the proximal ascending colon,  removed with a cold snare. Resected and retrieved.                           - Two 4 to 6 mm polyps in the mid transverse colon,                            removed with a cold snare. Resected and retrieved.                           - Mild sigmoid diverticulosis.                           - Non-bleeding internal hemorrhoids.                           - The examination was otherwise normal on direct                            and retroflexion views. Recommendation:           - Patient has a contact number available for                            emergencies. The signs and symptoms of potential                            delayed complications were discussed with the                            patient. Return to normal activities tomorrow.                            Written discharge instructions were provided to the                            patient.                           - Resume previous diet.                           - Continue present medications.                           - No aspirin, ibuprofen, naproxen, or other                            non-steroidal anti-inflammatory drugs for 5 days                            after polyp removal.                           - Await pathology results.                           - Recommend  flexible sigmoidoscopy in 6 months to                            ensure that there is no recurrence of rectal polyp.                           - Repeat colonoscopy for  surveillance based on                            pathology results.                           - The findings and recommendations were discussed                            with the patient. Lynann Bologna, MD 06/18/2023 9:39:56 AM This report has been signed electronically.

## 2023-06-18 NOTE — Progress Notes (Signed)
Patient arrived in procedure room 4 with R hand PIV infiltrated.  Started new 22g in L AC.

## 2023-06-18 NOTE — Patient Instructions (Signed)
Handout on polyps, diverticulosis, and polyps given to patient  Await pathology results Resume previous diet and continue present medications Repeat flexible sigmoidoscopy in 6 months to ensure that there is no recurrence of rectal polyp No aspirin, ibuprofen, naproxen, or other non-steroidal anti-inflammatory drugs for 5 days after polyp removal  Repeat colonoscopy for surveillance will be determined based off of pathology results    YOU HAD AN ENDOSCOPIC PROCEDURE TODAY AT THE [facility]:   Refer to the procedure report that was given to you for any specific questions about what was found during the examination.  If the procedure report does not answer your questions, please call your gastroenterologist to clarify.  If you requested that your care partner not be given the details of your procedure findings, then the procedure report has been included in a sealed envelope for you to review at your convenience later.  YOU SHOULD EXPECT: Some feelings of bloating in the abdomen. Passage of more gas than usual.  Walking can help get rid of the air that was put into your GI tract during the procedure and reduce the bloating. If you had a lower endoscopy (such as a colonoscopy or flexible sigmoidoscopy) you may notice spotting of blood in your stool or on the toilet paper. If you underwent a bowel prep for your procedure, you may not have a normal bowel movement for a few days.  Please Note:  You might notice some irritation and congestion in your nose or some drainage.  This is from the oxygen used during your procedure.  There is no need for concern and it should clear up in a day or so.  SYMPTOMS TO REPORT IMMEDIATELY:  Following lower endoscopy (colonoscopy or flexible sigmoidoscopy):  Excessive amounts of blood in the stool  Significant tenderness or worsening of abdominal pains  Swelling of the abdomen that is new, acute  Fever of 100F or higher  For urgent or emergent issues,  a gastroenterologist can be reached at any hour by calling [phone]. Do not use MyChart messaging for urgent concerns.    DIET:  We do recommend a small meal at first, but then you may proceed to your regular diet.  Drink plenty of fluids but you should avoid alcoholic beverages for 24 hours.  ACTIVITY:  You should plan to take it easy for the rest of today and you should NOT DRIVE or use heavy machinery until tomorrow (because of the sedation medicines used during the test).    FOLLOW UP: Our staff will call the number listed on your records the next business day following your procedure.  We will call around 7:15- 8:00 am to check on you and address any questions or concerns that you may have regarding the information given to you following your procedure. If we do not reach you, we will leave a message.     If any biopsies were taken you will be contacted by phone or by letter within the next 1-3 weeks.  Please call us at [phone] if you have not heard about the biopsies in 3 weeks.    SIGNATURES/CONFIDENTIALITY: You and/or your care partner have signed paperwork which will be entered into your electronic medical record.  These signatures attest to the fact that that the information above on your After Visit Summary has been reviewed and is understood.  Full responsibility of the confidentiality of this discharge information lies with you and/or your care-partner.

## 2023-06-18 NOTE — Progress Notes (Signed)
Called to room to assist during endoscopic procedure.  Patient ID and intended procedure confirmed with present staff. Received instructions for my participation in the procedure from the performing physician.  

## 2023-06-18 NOTE — Progress Notes (Signed)
Vss nad trans to pacu 

## 2023-06-18 NOTE — Progress Notes (Signed)
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 pylori infection    Treated in 2009 or 2010   Hardening of the aorta (main artery of the heart) (HCC) 02/28/2021   Hypertension    Hypertensive heart disease    LBBB (left bundle branch block) 07/11/2021   Paroxysmal atrial fibrillation (HCC) 02/22/2021   Prostatitis    Pure hypercholesterolemia 11/01/2021   Sinus node dysfunction (HCC) 07/11/2021   Formatting of this note might be different from the original. Added automatically from request for surgery [id number]   Travel advice encounter 12/15/2018    Past Surgical History:  Procedure Laterality Date   CARDIOVERSION N/A 02/23/2021   Procedure: CARDIOVERSION;  Surgeon: Jodelle Red, MD;  Location: [facility] ENDOSCOPY;  Service: Cardiovascular;  Laterality: N/A;   CATARACT EXTRACTION, BILATERAL     COLONOSCOPY     around 2009 or 2010. Was normal. [place]   ESOPHAGOGASTRODUODENOSCOPY     Around 2009 or 2010. [place] Treated for H pylori   NOSE SURGERY     TEE WITHOUT CARDIOVERSION N/A 02/23/2021   Procedure: TRANSESOPHAGEAL ECHOCARDIOGRAM (TEE);  Surgeon: Jodelle Red, MD;  Location: [facility] ENDOSCOPY;  Service: Cardiovascular;  Laterality: N/A;    Prior to Admission medications   Medication Sig Start Date End Date Taking? Authorizing Provider  Cholecalciferol (VITAMIN D) 125 MCG (5000 UT) CAPS Take 5,000 Units by mouth once a week.   Yes [provider]  metoprolol succinate (TOPROL-XL) 25 MG 24 hr tablet Take 1 tablet (25 mg total) by mouth daily. 12/11/22  Yes Crist Fat, MD  modafinil (PROVIGIL) 200 MG tablet Take 1 tablet (200 mg total)  by mouth daily. 03/17/23  Yes Crist Fat, MD  pioglitazone (ACTOS) 15 MG tablet Take 15 mg by mouth daily. 11/03/21  Yes [provider]  rosuvastatin (CRESTOR) 5 MG tablet Take 1 tablet (5 mg total) by mouth daily. 04/22/23  Yes Crist Fat, MD  sacubitril-valsartan (ENTRESTO) 24-26 MG Take 1 tablet by mouth 2 (two) times daily. 12/11/22  Yes Crist Fat, MD  SYNJARDY XR 12.03-999 MG TB24 Take 2 tablets by mouth every morning. 01/17/23  Yes Crist Fat, MD  apixaban (ELIQUIS) 5 MG TABS tablet TAKE 1 TABLET(5 MG) BY MOUTH TWICE DAILY Patient not taking: Reported on 06/04/2023 12/11/22   Crist Fat, MD  Dulaglutide (TRULICITY) 1.5 MG/0.5ML SOPN Inject 1.5 mg into the skin once a week. 05/02/23   Crist Fat, MD  nirmatrelvir & ritonavir (PAXLOVID, 300/100,) 20 x 150 MG & 10 x 100MG  TBPK Take 1 pack by mouth twice a day Patient not taking: Reported on 06/04/2023 12/18/22   Crist Fat, MD  ondansetron [facility]) 4 MG tablet Take 1 tablet (4 mg total) by mouth every 6 (six) hours as needed for nausea or vomiting. 12/18/22 12/18/23  Crist Fat, MD    Current Outpatient Medications  Medication Sig Dispense Refill   Cholecalciferol (VITAMIN D) 125 MCG (5000 UT) CAPS Take 5,000 Units by mouth once a week.     metoprolol succinate (TOPROL-XL) 25 MG 24  hr tablet Take 1 tablet (25 mg total) by mouth daily. 90 tablet 3   modafinil (PROVIGIL) 200 MG tablet Take 1 tablet (200 mg total) by mouth daily. 30 tablet 5   pioglitazone (ACTOS) 15 MG tablet Take 15 mg by mouth daily.     rosuvastatin (CRESTOR) 5 MG tablet Take 1 tablet (5 mg total) by mouth daily. 90 tablet 3   sacubitril-valsartan (ENTRESTO) 24-26 MG Take 1 tablet by mouth 2 (two) times daily. 180 tablet 3   SYNJARDY XR 12.03-999 MG TB24 Take 2 tablets by mouth every morning. 180 tablet 3   apixaban (ELIQUIS) 5 MG TABS tablet TAKE 1 TABLET(5 MG) BY MOUTH TWICE DAILY (Patient not taking: Reported on 06/04/2023) 180 tablet 3    Dulaglutide (TRULICITY) 1.5 MG/0.5ML SOPN Inject 1.5 mg into the skin once a week. 6 mL 3   nirmatrelvir & ritonavir (PAXLOVID, 300/100,) 20 x 150 MG & 10 x 100MG  TBPK Take 1 pack by mouth twice a day (Patient not taking: Reported on 06/04/2023) 1 each 0   ondansetron (ZOFRAN) 4 MG tablet Take 1 tablet (4 mg total) by mouth every 6 (six) hours as needed for nausea or vomiting. 30 tablet 1   Current Facility-Administered Medications  Medication Dose Route Frequency Provider Last Rate Last Admin   0.9 %  sodium chloride infusion  500 mL Intravenous Once Lynann Bologna, MD        Allergies as of 06/18/2023   (No Known Allergies)    Family History  Problem Relation Age of Onset   Hypertension Mother    CAD Father    Diabetes Brother        x 2   CAD Brother    Breast cancer Cousin    Colon cancer Neg Hx    Esophageal cancer Neg Hx    Colitis Neg Hx    Rectal cancer Neg Hx    Stomach cancer Neg Hx     Social History   Socioeconomic History   Marital status: Married    Spouse name: Not on file   Number of children: 4   Years of education: Not on file   Highest education level: Not on file  Occupational History   Occupation: Physician  Tobacco Use   Smoking status: Never   Smokeless tobacco: Never  Vaping Use   Vaping status: Never Used  Substance and Sexual Activity   Alcohol use: Never   Drug use: Never   Sexual activity: Not on file  Other Topics Concern   Not on file  Social History Narrative   Not on file   Social Determinants of Health   Financial Resource Strain: Not on file  Food Insecurity: No Food Insecurity (01/15/2022)   [facility], [facility] prior to 01/18/2023[facility] visits prior to 01/18/2023.   Hunger Vital Sign    Worried About Running Out of Food in the Last Year: Never true    Ran Out of Food in the Last Year: Never true  Transportation Needs: Not on file   Physical Activity: Not on file  Stress: Not on file  Social Connections: Unknown (06/19/2022)   Received from [facility], [facility]   Social Connections    Frequency of Communication with Friends and Family: Not asked    Frequency of Social Gatherings with Friends and Family: Not asked  Intimate Partner Violence: Unknown (06/19/2022)   Received from [facility], [facility]   Intimate Partner Violence    Fear of Current or Ex-Partner: Not asked    Emotionally Abused: Not asked    Physically Abused: Not asked    Sexually Abused: Not asked    Review of Systems: Positive for none All other review of systems negative except as mentioned in the HPI.  Physical Exam: Vital signs in last 24 hours: @VSRANGES @   General:   Alert,  Well-developed, well-nourished, pleasant and cooperative in NAD Lungs:  Clear throughout to auscultation.   Heart:  Regular rate and rhythm; no murmurs, clicks, rubs,  or gallops. Abdomen:  Soft, nontender and nondistended. Normal bowel sounds.   Neuro/Psych:  Alert and cooperative. Normal mood and affect. A and O x 3    No significant changes were identified.  The patient continues to be an appropriate candidate for the planned procedure and anesthesia.   Edman Circle, MD. [facility] 06/18/2023 8:48 AM@

## 2023-06-18 NOTE — Progress Notes (Signed)
Pt's states no medical or surgical changes since previsit or office visit. 

## 2023-06-20 ENCOUNTER — Telehealth: Payer: Self-pay

## 2023-06-20 NOTE — Telephone Encounter (Signed)
  Follow up Call-     06/18/2023    8:31 AM  Call back number  Post procedure Call Back phone  # [phone]  Permission to leave phone message Yes     Patient questions:  Do you have a fever, pain , or abdominal swelling? No. Pain Score  0 *  Have you tolerated food without any problems? Yes.    Have you been able to return to your normal activities? Yes.    Do you have any questions about your discharge instructions: Diet   No. Medications  No. Follow up visit  No.  Do you have questions or concerns about your Care? No.  Actions: * If pain score is 4 or above: No action needed, pain <4.

## 2023-06-27 ENCOUNTER — Encounter: Payer: Self-pay | Admitting: Gastroenterology

## 2023-07-01 ENCOUNTER — Ambulatory Visit: Payer: BC Managed Care – PPO | Attending: Cardiology | Admitting: Cardiology

## 2023-07-01 ENCOUNTER — Encounter: Payer: Self-pay | Admitting: Cardiology

## 2023-07-01 VITALS — BP 124/80 | HR 73 | Ht 66.0 in | Wt 169.4 lb

## 2023-07-01 DIAGNOSIS — I428 Other cardiomyopathies: Secondary | ICD-10-CM

## 2023-07-01 DIAGNOSIS — I447 Left bundle-branch block, unspecified: Secondary | ICD-10-CM

## 2023-07-01 DIAGNOSIS — Z950 Presence of cardiac pacemaker: Secondary | ICD-10-CM | POA: Diagnosis not present

## 2023-07-01 DIAGNOSIS — I480 Paroxysmal atrial fibrillation: Secondary | ICD-10-CM

## 2023-07-01 DIAGNOSIS — Z9889 Other specified postprocedural states: Secondary | ICD-10-CM

## 2023-07-01 DIAGNOSIS — Z8679 Personal history of other diseases of the circulatory system: Secondary | ICD-10-CM

## 2023-07-01 NOTE — Progress Notes (Signed)
Cardiology Office Note:    Date:  07/01/2023   ID:  Eloisa Northern, MD, DOB October 30, 1961, MRN [id number]  PCP:  Georgann Housekeeper, MD  Cardiologist:  Norman Herrlich, MD    Referring MD: Georgann Housekeeper, MD    ASSESSMENT:    1. PAF (paroxysmal atrial fibrillation) (HCC)   2. S/P ablation of atrial fibrillation   3. 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 LBBB (left bundle branch block)   5. Other cardiomyopathy (HCC)    PLAN:    In order of problems listed above:  Fortunately back in sinus rhythm spontaneously is going to go ahead and purchase mobile Kardia screen his heart rhythm at home keep his appointment with EP and remain anticoagulated. He will continue his guideline directed therapy including beta-blocker and Entresto maximally tolerated dose   Next appointment: 6 months   Medication Adjustments/Labs and Tests Ordered: Current medicines are reviewed at length with the patient today.  Concerns regarding medicines are outlined above.  Orders Placed This Encounter  Procedures   EKG 12-Lead   No orders of the defined types were placed in this encounter.    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  After his last visit with electrophysiology his anticoagulant was discontinued he restarted last evening.  He phoned me last evening and is back in atrial fibrillation  and requests further evaluation and treatment with cardioversion through [facility].  He was noted to be in atrial fibrillation 06/30/2023 and asymptomatic.  Compliance with diet,  lifestyle and medications: Yes  He is back on Eliquis I asked him to continue He is back in atrial sensed ventricular paced rhythm today He has a rate follow-up with the EP in October labs to remain on anticoagulants till that time. He is not having edema shortness of breath palpitation or syncope but he has felt somewhat weak and lightheaded when he was in atrial fibrillation Past Medical History:  Diagnosis Date   Cardiac pacemaker in situ 08/22/2021   Diabetes (HCC)    Dilated cardiomyopathy (HCC) 07/11/2021   Dyslipidemia    H. pylori infection    Treated in 2009 or 2010   Hardening of the aorta (main artery of the heart) (HCC) 02/28/2021   Hypertension    Hypertensive heart disease    LBBB (left bundle branch block) 07/11/2021   Paroxysmal atrial fibrillation (HCC) 02/22/2021   Prostatitis    Pure hypercholesterolemia 11/01/2021   Sinus node dysfunction (HCC) 07/11/2021   Formatting of this note might be different from the original. Added automatically from request for surgery [id number]   Travel advice encounter 12/15/2018    Current Medications: Current Meds  Medication Sig   apixaban (ELIQUIS) 5 MG TABS tablet TAKE 1 TABLET(5 MG) BY MOUTH TWICE DAILY   Cholecalciferol (VITAMIN D) 125 MCG (5000 UT) CAPS Take 5,000 Units by mouth once a week.   Dulaglutide (TRULICITY) 1.5 MG/0.5ML SOPN Inject 1.5 mg into the skin once a week.   metoprolol succinate (TOPROL-XL) 25 MG 24 hr tablet Take 1 tablet (25 mg total) by mouth daily.  modafinil (PROVIGIL) 200 MG tablet Take 1 tablet (200 mg total) by mouth daily.   nirmatrelvir & ritonavir (PAXLOVID, 300/100,) 20 x 150 MG & 10 x 100MG  TBPK Take 1 pack by mouth twice a day   pioglitazone (ACTOS) 15 MG tablet Take 15 mg by mouth daily.   rosuvastatin (CRESTOR) 5 MG tablet Take 1 tablet (5 mg total) by mouth daily.   sacubitril-valsartan (ENTRESTO) 24-26 MG Take 1 tablet by mouth 2 (two) times daily.   SYNJARDY XR 12.03-999 MG TB24 Take 2 tablets  by mouth every morning.      EKGs/Labs/Other Studies Reviewed:    The following studies were reviewed today:  Cardiac Studies & Procedures       ECHOCARDIOGRAM  ECHOCARDIOGRAM COMPLETE 11/21/2022  Narrative ECHOCARDIOGRAM REPORT    Patient Name:   MCKYLE TORTORA  Date of Exam: 11/21/2022 Medical Rec #:  [id number]  Height:       67.0 in Accession #:    [id number] Weight:       163.0 lb Date of Birth:  June 13, 1961 BSA:          1.854 m Patient Age:    61 years   BP:           130/80 mmHg Patient Gender: M          HR:           67 bpm. Exam Location:  [place]  Procedure: 2D Echo, Cardiac Doppler, Color Doppler and Strain Analysis  Indications:    Other cardiomyopathy (HCC) [I42.8 (ICD-10-CM)]; Hypertensive heart disease without heart failure [I11.9 (ICD-10-CM)]; PAF (paroxysmal atrial fibrillation) (HCC) [I48.0 (ICD-10-CM)]; S/P ablation of atrial fibrillation [Z98.890, Z86.79 (ICD-10-CM)]  History:        Patient has prior history of Echocardiogram examinations, most recent 02/23/2021. Dilated cardiomyopathy (HCC), Pacemaker, Arrythmias:Atrial Fibrillation and LBBB, Signs/Symptoms:Hypertensive Heart Disease; Risk Factors:Hypertension, Diabetes and Dyslipidemia.  Sonographer:    Louie Boston RDCS Referring Phys: [id number] Demorris Choyce J Eola Waldrep  IMPRESSIONS   1. Left ventricular ejection fraction, by estimation, is 50%%. The left ventricle has low normal function. The left ventricle has no regional wall motion abnormalities. Left ventricular diastolic parameters are consistent with Grade I diastolic dysfunction (impaired relaxation). There is mildly abnormal septal motion. 2. Right ventricular systolic function is normal. The right ventricular size is normal. There is normal pulmonary artery systolic pressure. 3. The mitral valve is normal in structure. No evidence of mitral valve regurgitation. No evidence of mitral stenosis. 4. The aortic valve is tricuspid. Aortic valve regurgitation is  not visualized. No aortic stenosis is present. 5. The inferior vena cava is normal in size with greater than 50% respiratory variability, suggesting right atrial pressure of 3 mmHg.  FINDINGS Left Ventricle: Left ventricular ejection fraction, by estimation, is 50%%. The left ventricle has low normal function. The left ventricle has no regional wall motion abnormalities. The left ventricular internal cavity size was normal in size. There is no left ventricular hypertrophy. Mildly abnormal septal motion and abnormal (paradoxical) septal motion consistent with post-operative status. Left ventricular diastolic parameters are consistent with Grade I diastolic dysfunction (impaired relaxation). Normal left ventricular filling pressure.  Right Ventricle: The right ventricular size is normal. No increase in right ventricular wall thickness. Right ventricular systolic function is normal. There is normal pulmonary artery systolic pressure. The tricuspid regurgitant velocity is 1.11 m/s, and with an assumed right atrial pressure of 3 mmHg, the estimated right ventricular systolic pressure is 7.9 mmHg.  Left Atrium: Left  atrial size was normal in size.  Right Atrium: Right atrial size was normal in size.  Pericardium: There is no evidence of pericardial effusion.  Mitral Valve: The mitral valve is normal in structure. No evidence of mitral valve regurgitation. No evidence of mitral valve stenosis.  Tricuspid Valve: The tricuspid valve is normal in structure. Tricuspid valve regurgitation is not demonstrated. No evidence of tricuspid stenosis.  Aortic Valve: The aortic valve is tricuspid. Aortic valve regurgitation is not visualized. No aortic stenosis is present.  Pulmonic Valve: The pulmonic valve was normal in structure. Pulmonic valve regurgitation is not visualized. No evidence of pulmonic stenosis.  Aorta: The aortic root is normal in size and structure and the aortic root, ascending aorta, aortic  arch and descending aorta are all structurally normal, with no evidence of dilitation or obstruction.  Venous: The pulmonary veins were not well visualized. The inferior vena cava is normal in size with greater than 50% respiratory variability, suggesting right atrial pressure of 3 mmHg.  IAS/Shunts: No atrial level shunt detected by color flow Doppler.  Additional Comments: A device lead is visualized in the right atrium and right ventricle.   LEFT VENTRICLE PLAX 2D LVIDd:         4.40 cm     Diastology LVIDs:         3.30 cm     LV e' medial:    4.79 cm/s LV PW:         1.00 cm     LV E/e' medial:  13.4 LV IVS:        0.90 cm     LV e' lateral:   9.03 cm/s LVOT diam:     2.00 cm     LV E/e' lateral: 7.1 LV SV:         65 LV SV Index:   35          2D Longitudinal Strain LVOT Area:     3.14 cm    2D Strain GLS Avg:     -11.1 %  LV Volumes (MOD) LV vol d, MOD A2C: 90.5 ml LV vol d, MOD A4C: 88.2 ml LV vol s, MOD A2C: 46.4 ml LV vol s, MOD A4C: 46.5 ml LV SV MOD A2C:     44.1 ml LV SV MOD A4C:     88.2 ml LV SV MOD BP:      43.2 ml  RIGHT VENTRICLE             IVC RV S prime:     13.90 cm/s  IVC diam: 1.40 cm TAPSE (M-mode): 2.7 cm  LEFT ATRIUM             Index        RIGHT ATRIUM           Index LA diam:        3.30 cm 1.78 cm/m   RA Area:     11.50 cm LA Vol (A2C):   75.4 ml 40.67 ml/m  RA Volume:   24.40 ml  13.16 ml/m LA Vol (A4C):   43.7 ml 23.57 ml/m LA Biplane Vol: 60.3 ml 32.53 ml/m AORTIC VALVE LVOT Vmax:   99.20 cm/s LVOT Vmean:  69.100 cm/s LVOT VTI:    0.206 m  AORTA Ao Root diam: 3.30 cm Ao Asc diam:  2.80 cm Ao Desc diam: 2.10 cm  MITRAL VALVE               TRICUSPID VALVE  MV Area (PHT): 4.26 cm    TR Peak grad:   4.9 mmHg MV Decel Time: 178 msec    TR Vmax:        111.00 cm/s MV E velocity: 64.30 cm/s MV A velocity: 56.10 cm/s  SHUNTS MV E/A ratio:  1.15        Systemic VTI:  0.21 m Systemic Diam: 2.00 cm  Norman Herrlich MD Electronically  signed by Norman Herrlich MD Signature Date/Time: 11/21/2022/5:16:29 PM    Final   TEE  ECHO TEE 02/23/2021  Narrative TRANSESOPHOGEAL ECHO REPORT    Patient Name:   JORDY FOLTYN  Date of Exam: 02/23/2021 Medical Rec #:  [id number]  Height:       67.0 in Accession #:    [id number] Weight:       213.6 lb Date of Birth:  1961-01-10 BSA:          2.080 m Patient Age:    59 years   BP:           113/75 mmHg Patient Gender: M          HR:           122 bpm. Exam Location:  Inpatient  Procedure: Transesophageal Echo, Color Doppler and Cardiac Doppler  Indications:     Atrial Fibrillation  History:         Patient has prior history of Echocardiogram examinations. Risk Factors:Hypertension, Dyslipidemia and Diabetes.  Sonographer:     Thurman Coyer RDCS (AE) Referring Phys:  [id number] Manson Passey Diagnosing Phys: Jodelle Red MD  PROCEDURE: After discussion of the risks and benefits of a TEE, an informed consent was obtained from the patient. The transesophogeal probe was passed without difficulty through the esophogus of the patient. Sedation performed by different physician. The patient was monitored while under deep sedation. Anesthestetic sedation was provided intravenously by Anesthesiology: 253.49mg  of Propofol, 60mg  of Lidocaine. Image quality was good. The patient developed no complications during the procedure. A successful direct current cardioversion was performed at 120 joules with 1 attempt.  IMPRESSIONS   1. Left ventricular ejection fraction, by estimation, is 35 to 40%. The left ventricle has moderately decreased function. The left ventricle demonstrates regional wall motion abnormalities (see scoring diagram/findings for description). There is moderate hypokinesis of the left ventricular, basal-mid septal wall. 2. Right ventricular systolic function is normal. The right ventricular size is normal. 3. No left atrial/left atrial appendage thrombus was  detected. 4. The mitral valve is normal in structure. Trivial mitral valve regurgitation. 5. The aortic valve is tricuspid. Aortic valve regurgitation is not visualized. 6. There is mild (Grade II) plaque involving the descending aorta.  Conclusion(s)/Recommendation(s): Abnormal EF/septal wall motion. Prominent in afib RVR, will get transthoracic echo in sinus rhythm for further evaluation.  FINDINGS Left Ventricle: Left ventricular ejection fraction, by estimation, is 35 to 40%. The left ventricle has moderately decreased function. The left ventricle demonstrates regional wall motion abnormalities. Moderate hypokinesis of the left ventricular, basal-mid septal wall. The left ventricular internal cavity size was normal in size.  Right Ventricle: The right ventricular size is normal. No increase in right ventricular wall thickness. Right ventricular systolic function is normal.  Left Atrium: Left atrial size was normal in size. No left atrial/left atrial appendage thrombus was detected.  Right Atrium: Right atrial size was normal in size.  Pericardium: Trivial pericardial effusion is present.  Mitral Valve: The mitral valve is normal in structure. Trivial mitral valve regurgitation.  Tricuspid Valve:  The tricuspid valve is normal in structure. Tricuspid valve regurgitation is trivial.  Aortic Valve: The aortic valve is tricuspid. Aortic valve regurgitation is not visualized.  Pulmonic Valve: The pulmonic valve was normal in structure. Pulmonic valve regurgitation is trivial.  Aorta: The aortic root and ascending aorta are structurally normal, with no evidence of dilitation. There is mild (Grade II) plaque involving the descending aorta.  IAS/Shunts: No atrial level shunt detected by color flow Doppler.  Jodelle Red MD Electronically signed by Jodelle Red MD Signature Date/Time: 02/23/2021/7:18:26 PM    Final   MONITORS  LONG TERM MONITOR-LIVE TELEMETRY (3-14  DAYS) 03/26/2021  Narrative Patch Wear Time:  14 days and 0 hours (2022-04-12T15:06:42-0400 to 2022-04-26T15:06:42-0400)  Patient had a min HR of 48 bpm, max HR of 129 bpm, and avg HR of 71 bpm. Predominant underlying rhythm was Sinus Rhythm. First Degree AV Block was present. Bundle Branch Block/IVCD was present. 8 Supraventricular Tachycardia runs occurred, the run with the fastest interval lasting 5 beats with a max rate of 129 bpm, the longest lasting 8 beats with an avg rate of 98 bpm. Second Degree AV Block-Mobitz I (Wenckebach) was present. Isolated SVEs were rare (<1.0%), SVE Couplets were rare (<1.0%), and no SVE Triplets were present. Isolated VEs were rare (<1.0%), and no VE Couplets or VE Triplets were present.  There were no pauses of 3 seconds or greater no episodes of Mobitz 2 second-degree or third-degree AV block or sinus node exit block.  Regular and supraventricular ectopy are rare.  For runs of APCs were present the longest 8 complexes rate of 98 bpm.  There was an episode of Mobitz 1 winky block second-degree AV block without pauses.  There were 2 triggered events associated sinus rhythm heart rate 69 and 83 bpm   CT SCANS  CT CORONARY MORPH W/CTA COR W/SCORE 02/23/2021  Addendum 02/23/2021  6:23 PM ADDENDUM REPORT: 02/23/2021 18:21  CLINICAL DATA:  62 Year-old Male  Caucasian MESA database comparison  EXAM: Cardiac/Coronary  CTA  TECHNIQUE: The patient was scanned on a [employer].  FINDINGS: A 100 kV prospective scan was triggered in the descending thoracic aorta at 111 HU's. Axial non-contrast 3 mm slices were carried out through the heart. The data set was analyzed on a dedicated work station and scored using the Agatson method. Gantry rotation speed was 250 msecs and collimation was .6 mm. No beta blockade and 0.8 mg of sl NTG was given. The 3D data set was reconstructed in 5% intervals of the 67-82 % of the R-R cycle. Diastolic phases  were analyzed on a dedicated work station using MPR, MIP and VRT modes. The patient received 80 cc of contrast.  Aorta:  Normal size.  No calcifications.  No dissection.  Aortic Valve:  Tri-leaflet.  No calcifications.  Coronary Arteries:  Normal coronary origin.  Left dominance.  Coronary Calcium Score:  Left main: 0  Left anterior descending artery: 0  Left circumflex artery: 0  Right coronary artery: 0  Total: 0  Percentile: 1st for age, sex, and race matched control.  RCA is a small non-dominant artery.  There is no plaque.  Left main is a large artery that gives rise to LAD and LCX arteries. There is a minimal non-obstructive (1-24%) soft plaque in the mid vessel.  LAD is a large vessel that gives rise to twp large Diagonal Branches. There is a minimal non-obstructive (1-24%) soft plaque in the mid vessel.  LCX is a dominant artery  that gives rise to two large OM branches. There is no plaque.  Other findings:  Atypical pulmonary vein drainage into the left atrium: Common left pulmonary vein.  Normal left atrial appendage without a sluggish flow in the distal tip but without evidence of thrombus.  Normal size of the pulmonary artery.  Extra-cardiac findings: See attached radiology report for non-cardiac structures.  IMPRESSION: 1. Coronary calcium score of 0. This was 1st percentile for age, sex, and race matched control.  2. Normal coronary origin with left dominance.  3. CAD-RADS 1. Minimal non-obstructive CAD (1-24%). Consider non-atherosclerotic causes of chest pain. Consider preventive therapy and risk factor modification.  4. Atypical pulmonary vein drainage into the left atrium: Common left pulmonary vein.  5. Normal left atrial appendage without a sluggish flow in the distal tip but without evidence of thrombus.  RECOMMENDATIONS:  Coronary artery calcium (CAC) score is a strong predictor of incident coronary heart disease (CHD) and  provides predictive information beyond traditional risk factors. CAC scoring is reasonable to use in the decision to withhold, postpone, or initiate statin therapy in intermediate-risk or selected borderline-risk asymptomatic adults (age 37-75 years and LDL-C ?70 to <190 mg/dL) who do not have diabetes or established atherosclerotic cardiovascular disease (ASCVD).* In intermediate-risk (10-year ASCVD risk ?7.5% to <20%) adults or selected borderline-risk (10-year ASCVD risk ?5% to <7.5%) adults in whom a CAC score is measured for the purpose of making a treatment decision the following recommendations have been made:  If CAC = 0, it is reasonable to withhold statin therapy and reassess in 5 to 10 years, as long as higher risk conditions are absent (diabetes mellitus, family history of premature CHD in first degree relatives (males <55 years; females <65 years), cigarette smoking, LDL ?190 mg/dL or other independent risk factors).  If CAC is 1 to 99, it is reasonable to initiate statin therapy for patients ?61 years of age.  If CAC is ?100 or ?75th percentile, it is reasonable to initiate statin therapy at any age.  Cardiology referral should be considered for patients with CAC scores ?400 or ?75th percentile.  *2018 AHA/ACC/AACVPR/AAPA/ABC/ACPM/ADA/AGS/APhA/ASPC/NLA/PCNA Guideline on the Management of Blood Cholesterol: A Report of the American College of Cardiology/American Heart Association Task Force on Clinical Practice Guidelines. J Am Coll Cardiol. 2019;73(24):3168-3209.  Riley Lam, MD   Electronically Signed By: Riley Lam MD On: 02/23/2021 18:21  Narrative EXAM: OVER-READ INTERPRETATION  CT CHEST  The following report is an over-read performed by radiologist Dr. Trudie Reed of [facility] on 02/23/2021. This over-read does not include interpretation of cardiac or coronary anatomy or pathology. The coronary calcium  score/coronary CTA interpretation by the cardiologist is attached.  COMPARISON:  Cardiac CT 09/21/2020.  FINDINGS: Within the visualized portions of the thorax there are no suspicious appearing pulmonary nodules or masses, there is no acute consolidative airspace disease, no pleural effusions, no pneumothorax and no lymphadenopathy. Visualized portions of the upper abdomen are unremarkable. There are no aggressive appearing lytic or blastic lesions noted in the visualized portions of the skeleton.  IMPRESSION: No significant incidental noncardiac findings are noted.  Electronically Signed: By: Trudie Reed M.D. On: 02/23/2021 16:14   CT SCANS  CT CARDIAC SCORING (SELF PAY ONLY) 09/21/2020  Narrative CLINICAL DATA:  Family history.  Hyperlipidemia.  EXAM: CT CARDIAC CORONARY ARTERY CALCIUM SCORE  TECHNIQUE: Non-contrast imaging through the heart was performed using prospective ECG gating. Image post processing was performed on an independent workstation, allowing for quantitative analysis  of the heart and coronary arteries. Note that this exam targets the heart and the chest was not imaged in its entirety.  COMPARISON:  None.  FINDINGS: CORONARY CALCIUM SCORES:  Left Main: 0  LAD: 0  LCx: 0  RCA: 0  Total Agatston Score: 0  MESA database percentile: 0  AORTA MEASUREMENTS:  Ascending Aorta: 34 mm  Descending Aorta: 23 mm  OTHER FINDINGS:  The heart is normal size. No adenopathy. Visualized lungs clear. No effusions. Imaging into the upper abdomen demonstrates no acute findings. Chest wall soft tissues are unremarkable. No acute bony abnormality.  IMPRESSION: No visible coronary artery calcifications. Total coronary calcium score of 0.  No acute or significant extracardiac abnormality.   Electronically Signed By: Charlett Nose M.D. On: 09/21/2020 19:09   CARDIAC MRI  MR CARDIAC MORPHOLOGY W WO CONTRAST 05/03/2021  Narrative CLINICAL  DATA:  Clinical question of cardiomyopathy 62 year old male  Study assume HCT of 50.7  EXAM: CARDIAC MRI  TECHNIQUE: The patient was scanned on a 1.5 Tesla GE magnet. A dedicated cardiac coil was used. Functional imaging was done using Fiesta sequences. 2,3, and 4 chamber views were done to assess for RWMA's. Modified Simpson's rule using a short axis stack was used to calculate an ejection fraction on a dedicated work [employer]. The patient received 8 cc of Gadavist. After 10 minutes inversion recovery sequences were used to assess for infiltration and scar tissue.  CONTRAST:  8 cc  of Gadavist  FINDINGS: 1. Normal left ventricular size, with LVEDD 57 mm and LVEDVi 81 mL/m2.  Normal left ventricular thickness, with intraventricular septal thickness of 5 mm, posterior wall thickness of 4 mm.  Mildly decreased left ventricular systolic function (LVEF =52%). There is paradoxical septal motion; this can be seen in intraventricular conduction delay.  Left ventricular parametric mapping notable for normal native T1 and ECV signal; normal T2 signal.  There is no late gadolinium enhancement in the left ventricular myocardium.  2. Normal right ventricular size with RVEDVI 70 mL/m2.  Normal right ventricular thickness.  Normal right ventricular systolic function (RVEF =54%). There are no regional wall motion abnormalities or aneurysms.  3. Mild left atrial dilation and normal right atrial size, with LAESV 45 mL/m2 and RAESV 34 mL/m2.  4. Normal size of the aortic root, ascending aorta and pulmonary artery.  5.  No significant valvular abnormalities  Aortic valve peak gradient 12 mm Hg; Regurgitation fraction 2%  Pulmonic valve peak gradient 4 mm Hg; Regurgitation fraction 3%  6.  Normal pericardium.  No pericardial effusion.  7. Grossly, no extracardiac findings. Recommended dedicated study if concerned for non-cardiac  pathology.  IMPRESSION: Mildly decrease LV function, LVEF 52%.  Riley Lam MD   Electronically Signed By: Riley Lam MD On: 05/04/2021 11:46             Recent Labs: No results found for requested labs within last 365 days.  Recent Lipid Panel No results found for: "CHOL", "TRIG", "HDL", "CHOLHDL", "VLDL", "LDLCALC", "LDLDIRECT"  Physical Exam:    VS:  BP 124/80 (BP Location: Right Arm, Patient Position: Sitting, Cuff Size: Normal)   Pulse 73   Ht 5\' 6"  (1.676 m)   Wt 169 lb 6.4 oz (76.8 kg)   SpO2 96%   BMI 27.34 kg/m     Wt Readings from Last 3 Encounters:  07/01/23 169 lb 6.4 oz (76.8 kg)  06/04/23 157 lb (71.2 kg)  10/23/22 163 lb (  73.9 kg)     GEN:  Well nourished, well developed in no acute distress HEENT: Normal NECK: No JVD; No carotid bruits LYMPHATICS: No lymphadenopathy CARDIAC: RRR, no murmurs, rubs, gallops RESPIRATORY:  Clear to auscultation without rales, wheezing or rhonchi  ABDOMEN: Soft, non-tender, non-distended MUSCULOSKELETAL:  No edema; No deformity  SKIN: Warm and dry NEUROLOGIC:  Alert and oriented x 3 PSYCHIATRIC:  Normal affect    Signed, Norman Herrlich, MD  07/01/2023 2:08 PM    [facility]

## 2023-07-01 NOTE — Patient Instructions (Addendum)
Medication Instructions:  Your physician recommends that you continue on your current medications as directed. Please refer to the Current Medication list given to you today.  *If you need a refill on your cardiac medications before your next appointment, please call your pharmacy*   Lab Work: None If you have labs (blood work) drawn today and your tests are completely normal, you will receive your results only by: MyChart Message (if you have MyChart) OR A paper copy in the mail If you have any lab test that is abnormal or we need to change your treatment, we will call you to review the results.   Testing/Procedures: None   Follow-Up: At [facility], you and your health needs are our priority.  As part of our continuing mission to provide you with exceptional heart care, we have created designated Provider Care Teams.  These Care Teams include your primary Cardiologist (physician) and Advanced Practice Providers (APPs -  Physician Assistants and Nurse Practitioners) who all work together to provide you with the care you need, when you need it.  We recommend signing up for the patient portal called "MyChart".  Sign up information is provided on this After Visit Summary.  MyChart is used to connect with patients for Virtual Visits (Telemedicine).  Patients are able to view lab/test results, encounter notes, upcoming appointments, etc.  Non-urgent messages can be sent to your provider as well.   To learn more about what you can do with MyChart, go to [email or web address].    Your next appointment:   6 month(s)  Provider:   Norman Herrlich, MD    Other Instructions Purchase a mobile [facility]

## 2023-07-12 DIAGNOSIS — I480 Paroxysmal atrial fibrillation: Secondary | ICD-10-CM | POA: Diagnosis not present

## 2023-07-12 DIAGNOSIS — Z45018 Encounter for adjustment and management of other part of cardiac pacemaker: Secondary | ICD-10-CM | POA: Diagnosis not present

## 2023-07-12 DIAGNOSIS — I495 Sick sinus syndrome: Secondary | ICD-10-CM | POA: Diagnosis not present

## 2023-07-24 ENCOUNTER — Other Ambulatory Visit: Payer: Self-pay | Admitting: Internal Medicine

## 2023-07-24 MED ORDER — TAMSULOSIN HCL 0.4 MG PO CAPS: 0.4000 mg | ORAL_CAPSULE | Freq: Every day | ORAL | 1 refills | Status: DC

## 2023-08-29 ENCOUNTER — Other Ambulatory Visit: Payer: Self-pay

## 2023-08-29 MED ORDER — PIOGLITAZONE HCL 15 MG PO TABS: 15.0000 mg | ORAL_TABLET | Freq: Every day | ORAL | 2 refills | Status: AC

## 2023-09-11 DIAGNOSIS — I495 Sick sinus syndrome: Secondary | ICD-10-CM | POA: Diagnosis not present

## 2023-09-12 ENCOUNTER — Ambulatory Visit: Payer: BC Managed Care – PPO | Admitting: Internal Medicine

## 2023-09-12 DIAGNOSIS — Z23 Encounter for immunization: Secondary | ICD-10-CM

## 2023-09-12 DIAGNOSIS — Z0000 Encounter for general adult medical examination without abnormal findings: Secondary | ICD-10-CM

## 2023-09-12 NOTE — Progress Notes (Signed)
Nurse visit for flu shot

## 2023-09-17 DIAGNOSIS — I447 Left bundle-branch block, unspecified: Secondary | ICD-10-CM | POA: Diagnosis not present

## 2023-09-17 DIAGNOSIS — I4891 Unspecified atrial fibrillation: Secondary | ICD-10-CM | POA: Diagnosis not present

## 2023-09-17 DIAGNOSIS — I480 Paroxysmal atrial fibrillation: Secondary | ICD-10-CM | POA: Diagnosis not present

## 2023-09-17 DIAGNOSIS — I471 Supraventricular tachycardia, unspecified: Secondary | ICD-10-CM | POA: Diagnosis not present

## 2023-09-17 DIAGNOSIS — I499 Cardiac arrhythmia, unspecified: Secondary | ICD-10-CM | POA: Diagnosis not present

## 2023-09-17 DIAGNOSIS — Z950 Presence of cardiac pacemaker: Secondary | ICD-10-CM | POA: Diagnosis not present

## 2023-09-17 DIAGNOSIS — I420 Dilated cardiomyopathy: Secondary | ICD-10-CM | POA: Diagnosis not present

## 2023-09-17 DIAGNOSIS — Z45018 Encounter for adjustment and management of other part of cardiac pacemaker: Secondary | ICD-10-CM | POA: Diagnosis not present

## 2023-09-17 DIAGNOSIS — I495 Sick sinus syndrome: Secondary | ICD-10-CM | POA: Diagnosis not present

## 2023-09-18 ENCOUNTER — Other Ambulatory Visit: Payer: Self-pay | Admitting: Internal Medicine

## 2023-09-18 DIAGNOSIS — I499 Cardiac arrhythmia, unspecified: Secondary | ICD-10-CM | POA: Diagnosis not present

## 2023-09-22 ENCOUNTER — Other Ambulatory Visit: Payer: Self-pay | Admitting: Internal Medicine

## 2023-09-22 MED ORDER — MODAFINIL 200 MG PO TABS: 200.0000 mg | ORAL_TABLET | Freq: Every day | ORAL | 5 refills | Status: DC

## 2023-09-30 ENCOUNTER — Other Ambulatory Visit: Payer: Self-pay

## 2023-09-30 MED ORDER — MENINGOCOCCAL A C Y&W-135 OLIG IM SOLN: 0.5000 mL | Freq: Once | INTRAMUSCULAR | 0 refills | Status: AC

## 2023-10-01 ENCOUNTER — Other Ambulatory Visit: Payer: Self-pay | Admitting: Internal Medicine

## 2023-10-01 MED ORDER — MODAFINIL 200 MG PO TABS: 200.0000 mg | ORAL_TABLET | Freq: Every day | ORAL | 5 refills | Status: AC

## 2023-10-02 DIAGNOSIS — Z23 Encounter for immunization: Secondary | ICD-10-CM | POA: Diagnosis not present

## 2023-10-07 ENCOUNTER — Other Ambulatory Visit: Payer: Self-pay

## 2023-10-07 MED ORDER — ONDANSETRON HCL 4 MG PO TABS: 4.0000 mg | ORAL_TABLET | Freq: Three times a day (TID) | ORAL | 0 refills | Status: AC | PRN

## 2023-10-23 DIAGNOSIS — Z125 Encounter for screening for malignant neoplasm of prostate: Secondary | ICD-10-CM | POA: Diagnosis not present

## 2023-10-23 DIAGNOSIS — E1169 Type 2 diabetes mellitus with other specified complication: Secondary | ICD-10-CM | POA: Diagnosis not present

## 2023-10-23 DIAGNOSIS — E119 Type 2 diabetes mellitus without complications: Secondary | ICD-10-CM | POA: Diagnosis not present

## 2023-10-23 DIAGNOSIS — I10 Essential (primary) hypertension: Secondary | ICD-10-CM | POA: Diagnosis not present

## 2023-10-23 DIAGNOSIS — Z23 Encounter for immunization: Secondary | ICD-10-CM | POA: Diagnosis not present

## 2023-10-23 DIAGNOSIS — Z0000 Encounter for general adult medical examination without abnormal findings: Secondary | ICD-10-CM | POA: Diagnosis not present

## 2023-10-23 DIAGNOSIS — E291 Testicular hypofunction: Secondary | ICD-10-CM | POA: Diagnosis not present

## 2023-10-23 DIAGNOSIS — I700 Atherosclerosis of aorta: Secondary | ICD-10-CM | POA: Diagnosis not present

## 2023-10-23 DIAGNOSIS — M6281 Muscle weakness (generalized): Secondary | ICD-10-CM | POA: Diagnosis not present

## 2023-10-23 DIAGNOSIS — D6869 Other thrombophilia: Secondary | ICD-10-CM | POA: Diagnosis not present

## 2023-12-10 ENCOUNTER — Other Ambulatory Visit: Payer: Self-pay

## 2023-12-10 MED ORDER — VALACYCLOVIR HCL 1 G PO TABS: 1000.0000 mg | ORAL_TABLET | Freq: Two times a day (BID) | ORAL | 0 refills | Status: AC

## 2023-12-11 DIAGNOSIS — I495 Sick sinus syndrome: Secondary | ICD-10-CM | POA: Diagnosis not present

## 2023-12-16 DIAGNOSIS — Z45018 Encounter for adjustment and management of other part of cardiac pacemaker: Secondary | ICD-10-CM | POA: Diagnosis not present

## 2024-01-26 ENCOUNTER — Other Ambulatory Visit: Payer: Self-pay

## 2024-01-26 MED ORDER — SYNJARDY XR 12.5-1000 MG PO TB24: 2.0000 | ORAL_TABLET | Freq: Every morning | ORAL | 3 refills | Status: AC

## 2024-01-26 MED ORDER — METOPROLOL SUCCINATE ER 25 MG PO TB24: 25.0000 mg | ORAL_TABLET | Freq: Every day | ORAL | 3 refills | Status: AC

## 2024-01-26 MED ORDER — TAMSULOSIN HCL 0.4 MG PO CAPS: 0.4000 mg | ORAL_CAPSULE | Freq: Every day | ORAL | 1 refills | Status: DC

## 2024-02-02 ENCOUNTER — Other Ambulatory Visit: Payer: Self-pay

## 2024-02-02 MED ORDER — TRULICITY 1.5 MG/0.5ML ~~LOC~~ SOAJ: 1.5000 mg | SUBCUTANEOUS | 3 refills | Status: AC

## 2024-02-09 ENCOUNTER — Other Ambulatory Visit: Payer: Self-pay | Admitting: Internal Medicine

## 2024-02-09 ENCOUNTER — Other Ambulatory Visit: Payer: Self-pay

## 2024-02-09 MED ORDER — ENTRESTO 24-26 MG PO TABS: 1.0000 | ORAL_TABLET | Freq: Two times a day (BID) | ORAL | 3 refills | Status: AC

## 2024-02-09 MED ORDER — APIXABAN 5 MG PO TABS: ORAL_TABLET | ORAL | 3 refills | Status: AC

## 2024-02-11 ENCOUNTER — Encounter: Payer: Self-pay | Admitting: Gastroenterology

## 2024-03-04 ENCOUNTER — Ambulatory Visit: Payer: BC Managed Care – PPO | Admitting: Cardiology

## 2024-04-22 DIAGNOSIS — E1169 Type 2 diabetes mellitus with other specified complication: Secondary | ICD-10-CM | POA: Diagnosis not present

## 2024-04-22 DIAGNOSIS — I480 Paroxysmal atrial fibrillation: Secondary | ICD-10-CM | POA: Diagnosis not present

## 2024-04-22 DIAGNOSIS — D6869 Other thrombophilia: Secondary | ICD-10-CM | POA: Diagnosis not present

## 2024-04-22 DIAGNOSIS — Z23 Encounter for immunization: Secondary | ICD-10-CM | POA: Diagnosis not present

## 2024-04-22 DIAGNOSIS — I10 Essential (primary) hypertension: Secondary | ICD-10-CM | POA: Diagnosis not present

## 2024-04-23 DIAGNOSIS — E1169 Type 2 diabetes mellitus with other specified complication: Secondary | ICD-10-CM | POA: Diagnosis not present

## 2024-04-23 DIAGNOSIS — E291 Testicular hypofunction: Secondary | ICD-10-CM | POA: Diagnosis not present

## 2024-05-19 ENCOUNTER — Ambulatory Visit: Admitting: Cardiology

## 2024-06-09 DIAGNOSIS — Z950 Presence of cardiac pacemaker: Secondary | ICD-10-CM | POA: Diagnosis not present

## 2024-06-28 NOTE — Progress Notes (Deleted)
 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 PLAN:    In order of problems listed above:  ***   Next appointment: ***   Medication Adjustments/Labs and Tests Ordered: Current medicines are reviewed at length with the patient today.  Concerns regarding medicines are outlined above.  No orders of the defined types were placed in this encounter.  No orders of the defined types were placed in this encounter.    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  GP cares through [facility] shows him to be ventricularly paced 98% of the time and his atrial fibrillation burden 03/13/2024 was 2% over 6 months Compliance with diet, lifestyle and medications: *** Past Medical History:  Diagnosis Date   Cardiac pacemaker in situ 08/22/2021   Diabetes (HCC)    Dilated cardiomyopathy (HCC) 07/11/2021   Dyslipidemia    H. pylori infection    Treated in 2009 or 2010   Hardening of the aorta (main artery of the heart) (HCC) 02/28/2021   Hypertension    Hypertensive heart disease    LBBB (left bundle branch block) 07/11/2021   Paroxysmal atrial fibrillation (HCC) 02/22/2021   Prostatitis    Pure hypercholesterolemia 11/01/2021   Sinus node dysfunction (HCC) 07/11/2021   Formatting of this note might be different from the original. Added automatically from request for surgery [id number]   Travel advice encounter 12/15/2018     Current Medications: No outpatient medications have been marked as taking for the 06/29/24 encounter (Appointment) with Leiter Redell PARAS, MD.      EKGs/Labs/Other Studies Reviewed:    The following studies were reviewed today:  Cardiac Studies & Procedures   ______________________________________________________________________________________________     ECHOCARDIOGRAM  ECHOCARDIOGRAM COMPLETE 11/21/2022  Narrative ECHOCARDIOGRAM REPORT    Patient Name:   [name]  Date of Exam: 11/21/2022 Medical Rec #:  [id number]  Height:       67.0 in Accession #:    [id number] Weight:       163.0 lb Date of Birth:  06-Jun-1961 BSA:          1.854 m Patient Age:    61 years   BP:           130/80 mmHg Patient Gender: M          HR:           67 bpm. Exam Location:  [place]  Procedure: 2D Echo, Cardiac Doppler, Color Doppler and Strain Analysis  Indications:    Other cardiomyopathy (HCC) [I42.8 (ICD-10-CM)]; Hypertensive heart disease without heart failure [I11.9 (ICD-10-CM)]; PAF (paroxysmal atrial fibrillation) (HCC) [I48.0 (ICD-10-CM)]; S/P ablation of atrial fibrillation [Z98.890, Z86.79 (ICD-10-CM)]  History:        Patient has prior history of Echocardiogram examinations, most recent 02/23/2021. Dilated cardiomyopathy (HCC), Pacemaker, Arrythmias:Atrial Fibrillation and LBBB, Signs/Symptoms:Hypertensive Heart Disease; Risk Factors:Hypertension, Diabetes and Dyslipidemia.  Sonographer:    Lynwood Silvas  RDCS Referring Phys: [id number] [name]  IMPRESSIONS   1. Left ventricular ejection fraction, by estimation, is 50%%. The left ventricle has low normal function. The left ventricle has no regional wall motion abnormalities. Left ventricular diastolic parameters are consistent with Grade I diastolic dysfunction (impaired relaxation). There is mildly abnormal septal motion. 2. Right ventricular systolic function is normal. The right ventricular size is normal. There is normal  pulmonary artery systolic pressure. 3. The mitral valve is normal in structure. No evidence of mitral valve regurgitation. No evidence of mitral stenosis. 4. The aortic valve is tricuspid. Aortic valve regurgitation is not visualized. No aortic stenosis is present. 5. The inferior vena cava is normal in size with greater than 50% respiratory variability, suggesting right atrial pressure of 3 mmHg.  FINDINGS Left Ventricle: Left ventricular ejection fraction, by estimation, is 50%%. The left ventricle has low normal function. The left ventricle has no regional wall motion abnormalities. The left ventricular internal cavity size was normal in size. There is no left ventricular hypertrophy. Mildly abnormal septal motion and abnormal (paradoxical) septal motion consistent with post-operative status. Left ventricular diastolic parameters are consistent with Grade I diastolic dysfunction (impaired relaxation). Normal left ventricular filling pressure.  Right Ventricle: The right ventricular size is normal. No increase in right ventricular wall thickness. Right ventricular systolic function is normal. There is normal pulmonary artery systolic pressure. The tricuspid regurgitant velocity is 1.11 m/s, and with an assumed right atrial pressure of 3 mmHg, the estimated right ventricular systolic pressure is 7.9 mmHg.  Left Atrium: Left atrial size was normal in size.  Right Atrium: Right atrial size was normal in size.  Pericardium: There is no evidence of pericardial effusion.  Mitral Valve: The mitral valve is normal in structure. No evidence of mitral valve regurgitation. No evidence of mitral valve stenosis.  Tricuspid Valve: The tricuspid valve is normal in structure. Tricuspid valve regurgitation is not demonstrated. No evidence of tricuspid stenosis.  Aortic Valve: The aortic valve is tricuspid. Aortic valve regurgitation is not visualized. No aortic stenosis is present.  Pulmonic Valve: The  pulmonic valve was normal in structure. Pulmonic valve regurgitation is not visualized. No evidence of pulmonic stenosis.  Aorta: The aortic root is normal in size and structure and the aortic root, ascending aorta, aortic arch and descending aorta are all structurally normal, with no evidence of dilitation or obstruction.  Venous: The pulmonary veins were not well visualized. The inferior vena cava is normal in size with greater than 50% respiratory variability, suggesting right atrial pressure of 3 mmHg.  IAS/Shunts: No atrial level shunt detected by color flow Doppler.  Additional Comments: A device lead is visualized in the right atrium and right ventricle.   LEFT VENTRICLE PLAX 2D LVIDd:         4.40 cm     Diastology LVIDs:         3.30 cm     LV e' medial:    4.79 cm/s LV PW:         1.00 cm     LV E/e' medial:  13.4 LV IVS:        0.90 cm     LV e' lateral:   9.03 cm/s LVOT diam:     2.00 cm     LV E/e' lateral: 7.1 LV SV:         65 LV SV Index:   35          2D Longitudinal Strain LVOT  Area:     3.14 cm    2D Strain GLS Avg:     -11.1 %  LV Volumes (MOD) LV vol d, MOD A2C: 90.5 ml LV vol d, MOD A4C: 88.2 ml LV vol s, MOD A2C: 46.4 ml LV vol s, MOD A4C: 46.5 ml LV SV MOD A2C:     44.1 ml LV SV MOD A4C:     88.2 ml LV SV MOD BP:      43.2 ml  RIGHT VENTRICLE             IVC RV S prime:     13.90 cm/s  IVC diam: 1.40 cm TAPSE (M-mode): 2.7 cm  LEFT ATRIUM             Index        RIGHT ATRIUM           Index LA diam:        3.30 cm 1.78 cm/m   RA Area:     11.50 cm LA Vol (A2C):   75.4 ml 40.67 ml/m  RA Volume:   24.40 ml  13.16 ml/m LA Vol (A4C):   43.7 ml 23.57 ml/m LA Biplane Vol: 60.3 ml 32.53 ml/m AORTIC VALVE LVOT Vmax:   99.20 cm/s LVOT Vmean:  69.100 cm/s LVOT VTI:    0.206 m  AORTA Ao Root diam: 3.30 cm Ao Asc diam:  2.80 cm Ao Desc diam: 2.10 cm  MITRAL VALVE               TRICUSPID VALVE MV Area (PHT): 4.26 cm    TR Peak grad:   4.9  mmHg MV Decel Time: 178 msec    TR Vmax:        111.00 cm/s MV E velocity: 64.30 cm/s MV A velocity: 56.10 cm/s  SHUNTS MV E/A ratio:  1.15        Systemic VTI:  0.21 m Systemic Diam: 2.00 cm  Redell Leiter MD Electronically signed by Redell Leiter MD Signature Date/Time: 11/21/2022/5:16:29 PM    Final   TEE  ECHO TEE 02/23/2021  Narrative TRANSESOPHOGEAL ECHO REPORT    Patient Name:   LYNWOOD KUBISIAK  Date of Exam: 02/23/2021 Medical Rec #:  [id number]  Height:       67.0 in Accession #:    [id number] Weight:       213.6 lb Date of Birth:  [birth year] BSA:          2.080 m Patient Age:    59 years   BP:           113/75 mmHg Patient Gender: M          HR:           122 bpm. Exam Location:  Inpatient  Procedure: Transesophageal Echo, Color Doppler and Cardiac Doppler  Indications:     Atrial Fibrillation  History:         Patient has prior history of Echocardiogram examinations. Risk Factors:Hypertension, Dyslipidemia and Diabetes.  Sonographer:     Augustin Seals RDCS (AE) Referring Phys:  [id number] ALEENE JERNIGAN Diagnosing Phys: Shelda Bruckner MD  PROCEDURE: After discussion of the risks and benefits of a TEE, an informed consent was obtained from the patient. The transesophogeal probe was passed without difficulty through the esophogus of the patient. Sedation performed by different physician. The patient was monitored while under deep sedation. Anesthestetic sedation was provided intravenously by Anesthesiology: 253.49mg  of Propofol , 60mg  of Lidocaine . Image quality was  good. The patient developed no complications during the procedure. A successful direct current cardioversion was performed at 120 joules with 1 attempt.  IMPRESSIONS   1. Left ventricular ejection fraction, by estimation, is 35 to 40%. The left ventricle has moderately decreased function. The left ventricle demonstrates regional wall motion abnormalities (see scoring diagram/findings for  description). There is moderate hypokinesis of the left ventricular, basal-mid septal wall. 2. Right ventricular systolic function is normal. The right ventricular size is normal. 3. No left atrial/left atrial appendage thrombus was detected. 4. The mitral valve is normal in structure. Trivial mitral valve regurgitation. 5. The aortic valve is tricuspid. Aortic valve regurgitation is not visualized. 6. There is mild (Grade II) plaque involving the descending aorta.  Conclusion(s)/Recommendation(s): Abnormal EF/septal wall motion. Prominent in afib RVR, will get transthoracic echo in sinus rhythm for further evaluation.  FINDINGS Left Ventricle: Left ventricular ejection fraction, by estimation, is 35 to 40%. The left ventricle has moderately decreased function. The left ventricle demonstrates regional wall motion abnormalities. Moderate hypokinesis of the left ventricular, basal-mid septal wall. The left ventricular internal cavity size was normal in size.  Right Ventricle: The right ventricular size is normal. No increase in right ventricular wall thickness. Right ventricular systolic function is normal.  Left Atrium: Left atrial size was normal in size. No left atrial/left atrial appendage thrombus was detected.  Right Atrium: Right atrial size was normal in size.  Pericardium: Trivial pericardial effusion is present.  Mitral Valve: The mitral valve is normal in structure. Trivial mitral valve regurgitation.  Tricuspid Valve: The tricuspid valve is normal in structure. Tricuspid valve regurgitation is trivial.  Aortic Valve: The aortic valve is tricuspid. Aortic valve regurgitation is not visualized.  Pulmonic Valve: The pulmonic valve was normal in structure. Pulmonic valve regurgitation is trivial.  Aorta: The aortic root and ascending aorta are structurally normal, with no evidence of dilitation. There is mild (Grade II) plaque involving the descending aorta.  IAS/Shunts: No  atrial level shunt detected by color flow Doppler.  Shelda Bruckner MD Electronically signed by Shelda Bruckner MD Signature Date/Time: 02/23/2021/7:18:26 PM    Final  MONITORS  LONG TERM MONITOR-LIVE TELEMETRY (3-14 DAYS) 03/26/2021  Narrative Patch Wear Time:  14 days and 0 hours (2022-04-12T15:06:42-0400 to 2022-04-26T15:06:42-0400)  Patient had a min HR of 48 bpm, max HR of 129 bpm, and avg HR of 71 bpm. Predominant underlying rhythm was Sinus Rhythm. First Degree AV Block was present. Bundle Branch Block/IVCD was present. 8 Supraventricular Tachycardia runs occurred, the run with the fastest interval lasting 5 beats with a max rate of 129 bpm, the longest lasting 8 beats with an avg rate of 98 bpm. Second Degree AV Block-Mobitz I (Wenckebach) was present. Isolated SVEs were rare (<1.0%), SVE Couplets were rare (<1.0%), and no SVE Triplets were present. Isolated VEs were rare (<1.0%), and no VE Couplets or VE Triplets were present.  There were no pauses of 3 seconds or greater no episodes of Mobitz 2 second-degree or third-degree AV block or sinus node exit block.  Regular and supraventricular ectopy are rare.  For runs of APCs were present the longest 8 complexes rate of 98 bpm.  There was an episode of Mobitz 1 winky block second-degree AV block without pauses.  There were 2 triggered events associated sinus rhythm heart rate 69 and 83 bpm   CT SCANS  CT CORONARY MORPH W/CTA COR W/SCORE 02/23/2021  Addendum 02/23/2021  6:23 PM ADDENDUM REPORT: 02/23/2021 18:21  CLINICAL DATA:  63 Year-old Male  Caucasian MESA database comparison  EXAM: Cardiac/Coronary  CTA  TECHNIQUE: The patient was scanned on a [employer].  FINDINGS: A 100 kV prospective scan was triggered in the descending thoracic aorta at 111 HU's. Axial non-contrast 3 mm slices were carried out through the heart. The data set was analyzed on a dedicated work station and scored using the  Agatson method. Gantry rotation speed was 250 msecs and collimation was .6 mm. No beta blockade and 0.8 mg of sl NTG was given. The 3D data set was reconstructed in 5% intervals of the 67-82 % of the R-R cycle. Diastolic phases were analyzed on a dedicated work station using MPR, MIP and VRT modes. The patient received 80 cc of contrast.  Aorta:  Normal size.  No calcifications.  No dissection.  Aortic Valve:  Tri-leaflet.  No calcifications.  Coronary Arteries:  Normal coronary origin.  Left dominance.  Coronary Calcium  Score:  Left main: 0  Left anterior descending artery: 0  Left circumflex artery: 0  Right coronary artery: 0  Total: 0  Percentile: 1st for age, sex, and race matched control.  RCA is a small non-dominant artery.  There is no plaque.  Left main is a large artery that gives rise to LAD and LCX arteries. There is a minimal non-obstructive (1-24%) soft plaque in the mid vessel.  LAD is a large vessel that gives rise to twp large Diagonal Branches. There is a minimal non-obstructive (1-24%) soft plaque in the mid vessel.  LCX is a dominant artery that gives rise to two large OM branches. There is no plaque.  Other findings:  Atypical pulmonary vein drainage into the left atrium: Common left pulmonary vein.  Normal left atrial appendage without a sluggish flow in the distal tip but without evidence of thrombus.  Normal size of the pulmonary artery.  Extra-cardiac findings: See attached radiology report for non-cardiac structures.  IMPRESSION: 1. Coronary calcium  score of 0. This was 1st percentile for age, sex, and race matched control.  2. Normal coronary origin with left dominance.  3. CAD-RADS 1. Minimal non-obstructive CAD (1-24%). Consider non-atherosclerotic causes of chest pain. Consider preventive therapy and risk factor modification.  4. Atypical pulmonary vein drainage into the left atrium: Common left pulmonary vein.  5. Normal  left atrial appendage without a sluggish flow in the distal tip but without evidence of thrombus.  RECOMMENDATIONS:  Coronary artery calcium  (CAC) score is a strong predictor of incident coronary heart disease (CHD) and provides predictive information beyond traditional risk factors. CAC scoring is reasonable to use in the decision to withhold, postpone, or initiate statin therapy in intermediate-risk or selected borderline-risk asymptomatic adults (age 45-75 years and LDL-C ?70 to <190 mg/dL) who do not have diabetes or established atherosclerotic cardiovascular disease (ASCVD).* In intermediate-risk (10-year ASCVD risk ?7.5% to <20%) adults or selected borderline-risk (10-year ASCVD risk ?5% to <7.5%) adults in whom a CAC score is measured for the purpose of making a treatment decision the following recommendations have been made:  If CAC = 0, it is reasonable to withhold statin therapy and reassess in 5 to 10 years, as long as higher risk conditions are absent (diabetes mellitus, family history of premature CHD in first degree relatives (males <55 years; females <65 years), cigarette smoking, LDL ?190 mg/dL or other independent risk factors).  If CAC is 1 to 99, it is reasonable to initiate statin therapy for patients ?62 years of age.  If CAC is ?100  or ?75th percentile, it is reasonable to initiate statin therapy at any age.  Cardiology referral should be considered for patients with CAC scores ?400 or ?75th percentile.  *2018 AHA/ACC/AACVPR/AAPA/ABC/ACPM/ADA/AGS/APhA/ASPC/NLA/PCNA Guideline on the Management of Blood Cholesterol: A Report of the American College of Cardiology/American Heart Association Task Force on Clinical Practice Guidelines. J Am Coll Cardiol. 2019;73(24):3168-3209.  Stanly Leavens, MD   Electronically Signed By: Stanly Leavens MD On: 02/23/2021 18:21  Narrative EXAM: OVER-READ INTERPRETATION  CT CHEST  The following report is  an over-read performed by radiologist Dr. Toribio Aye of [facility] on 02/23/2021. This over-read does not include interpretation of cardiac or coronary anatomy or pathology. The coronary calcium  score/coronary CTA interpretation by the cardiologist is attached.  COMPARISON:  Cardiac CT 09/21/2020.  FINDINGS: Within the visualized portions of the thorax there are no suspicious appearing pulmonary nodules or masses, there is no acute consolidative airspace disease, no pleural effusions, no pneumothorax and no lymphadenopathy. Visualized portions of the upper abdomen are unremarkable. There are no aggressive appearing lytic or blastic lesions noted in the visualized portions of the skeleton.  IMPRESSION: No significant incidental noncardiac findings are noted.  Electronically Signed: By: Toribio Aye M.D. On: 02/23/2021 16:14   CT SCANS  CT CARDIAC SCORING (SELF PAY ONLY) 09/21/2020  Narrative CLINICAL DATA:  Family history.  Hyperlipidemia.  EXAM: CT CARDIAC CORONARY ARTERY CALCIUM  SCORE  TECHNIQUE: Non-contrast imaging through the heart was performed using prospective ECG gating. Image post processing was performed on an independent workstation, allowing for quantitative analysis of the heart and coronary arteries. Note that this exam targets the heart and the chest was not imaged in its entirety.  COMPARISON:  None.  FINDINGS: CORONARY CALCIUM  SCORES:  Left Main: 0  LAD: 0  LCx: 0  RCA: 0  Total Agatston Score: 0  MESA database percentile: 0  AORTA MEASUREMENTS:  Ascending Aorta: 34 mm  Descending Aorta: 23 mm  OTHER FINDINGS:  The heart is normal size. No adenopathy. Visualized lungs clear. No effusions. Imaging into the upper abdomen demonstrates no acute findings. Chest wall soft tissues are unremarkable. No acute bony abnormality.  IMPRESSION: No visible coronary artery calcifications. Total coronary calcium  score of  0.  No acute or significant extracardiac abnormality.   Electronically Signed By: Franky Crease M.D. On: 09/21/2020 19:09   CARDIAC MRI  MR CARDIAC MORPHOLOGY W WO CONTRAST 05/03/2021  Narrative CLINICAL DATA:  Clinical question of cardiomyopathy 63 year old male  Study assume HCT of 50.7  EXAM: CARDIAC MRI  TECHNIQUE: The patient was scanned on a 1.5 Tesla GE magnet. A dedicated cardiac coil was used. Functional imaging was done using Fiesta sequences. 2,3, and 4 chamber views were done to assess for RWMA's. Modified Simpson's rule using a short axis stack was used to calculate an ejection fraction on a dedicated work [employer]. The patient received 8 cc of Gadavist . After 10 minutes inversion recovery sequences were used to assess for infiltration and scar tissue.  CONTRAST:  8 cc  of Gadavist   FINDINGS: 1. Normal left ventricular size, with LVEDD 57 mm and LVEDVi 81 mL/m2.  Normal left ventricular thickness, with intraventricular septal thickness of 5 mm, posterior wall thickness of 4 mm.  Mildly decreased left ventricular systolic function (LVEF =52%). There is paradoxical septal motion; this can be seen in intraventricular conduction delay.  Left ventricular parametric mapping notable for normal native T1 and ECV signal; normal T2 signal.  There is  no late gadolinium enhancement in the left ventricular myocardium.  2. Normal right ventricular size with RVEDVI 70 mL/m2.  Normal right ventricular thickness.  Normal right ventricular systolic function (RVEF =54%). There are no regional wall motion abnormalities or aneurysms.  3. Mild left atrial dilation and normal right atrial size, with LAESV 45 mL/m2 and RAESV 34 mL/m2.  4. Normal size of the aortic root, ascending aorta and pulmonary artery.  5.  No significant valvular abnormalities  Aortic valve peak gradient 12 mm Hg; Regurgitation fraction 2%  Pulmonic valve peak gradient  4 mm Hg; Regurgitation fraction 3%  6.  Normal pericardium.  No pericardial effusion.  7. Grossly, no extracardiac findings. Recommended dedicated study if concerned for non-cardiac pathology.  IMPRESSION: Mildly decrease LV function, LVEF 52%.  Stanly Leavens MD   Electronically Signed By: Stanly Leavens MD On: 05/04/2021 11:46   ______________________________________________________________________________________________          Recent Labs: No results found for requested labs within last 365 days.  Recent Lipid Panel No results found for: CHOL, TRIG, HDL, CHOLHDL, VLDL, LDLCALC, LDLDIRECT  Physical Exam:    VS:  There were no vitals taken for this visit.    Wt Readings from Last 3 Encounters:  07/01/23 169 lb 6.4 oz (76.8 kg)  06/04/23 157 lb (71.2 kg)  10/23/22 163 lb (73.9 kg)     GEN: *** Well nourished, well developed in no acute distress HEENT: Normal NECK: No JVD; No carotid bruits LYMPHATICS: No lymphadenopathy CARDIAC: ***RRR, no murmurs, rubs, gallops RESPIRATORY:  Clear to auscultation without rales, wheezing or rhonchi  ABDOMEN: Soft, non-tender, non-distended MUSCULOSKELETAL:  No edema; No deformity  SKIN: Warm and dry NEUROLOGIC:  Alert and oriented x 3 PSYCHIATRIC:  Normal affect    Signed, Redell Leiter, MD  06/28/2024 2:18 PM    [facility]

## 2024-06-29 ENCOUNTER — Ambulatory Visit: Attending: Cardiology | Admitting: Cardiology

## 2024-06-29 DIAGNOSIS — D6869 Other thrombophilia: Secondary | ICD-10-CM | POA: Insufficient documentation

## 2024-06-29 DIAGNOSIS — Z8679 Personal history of other diseases of the circulatory system: Secondary | ICD-10-CM

## 2024-06-29 DIAGNOSIS — Z7901 Long term (current) use of anticoagulants: Secondary | ICD-10-CM

## 2024-06-29 DIAGNOSIS — I5032 Chronic diastolic (congestive) heart failure: Secondary | ICD-10-CM

## 2024-06-29 DIAGNOSIS — I480 Paroxysmal atrial fibrillation: Secondary | ICD-10-CM

## 2024-06-29 DIAGNOSIS — Z950 Presence of cardiac pacemaker: Secondary | ICD-10-CM

## 2024-06-29 DIAGNOSIS — E119 Type 2 diabetes mellitus without complications: Secondary | ICD-10-CM | POA: Insufficient documentation

## 2024-06-29 DIAGNOSIS — I10 Essential (primary) hypertension: Secondary | ICD-10-CM | POA: Insufficient documentation

## 2024-06-29 DIAGNOSIS — R7989 Other specified abnormal findings of blood chemistry: Secondary | ICD-10-CM | POA: Insufficient documentation

## 2024-06-29 DIAGNOSIS — I447 Left bundle-branch block, unspecified: Secondary | ICD-10-CM

## 2024-07-25 ENCOUNTER — Other Ambulatory Visit: Payer: Self-pay | Admitting: Internal Medicine

## 2024-08-30 ENCOUNTER — Ambulatory Visit: Admitting: Internal Medicine

## 2024-08-30 DIAGNOSIS — Z23 Encounter for immunization: Secondary | ICD-10-CM

## 2024-08-30 NOTE — Progress Notes (Signed)
 Nurse Visit Flu shot

## 2024-09-08 DIAGNOSIS — Z950 Presence of cardiac pacemaker: Secondary | ICD-10-CM | POA: Diagnosis not present

## 2024-09-22 DIAGNOSIS — Z950 Presence of cardiac pacemaker: Secondary | ICD-10-CM | POA: Diagnosis not present

## 2024-09-22 DIAGNOSIS — I4892 Unspecified atrial flutter: Secondary | ICD-10-CM | POA: Diagnosis not present

## 2024-09-22 DIAGNOSIS — I499 Cardiac arrhythmia, unspecified: Secondary | ICD-10-CM | POA: Diagnosis not present

## 2024-09-22 DIAGNOSIS — I495 Sick sinus syndrome: Secondary | ICD-10-CM | POA: Diagnosis not present

## 2024-09-22 DIAGNOSIS — I447 Left bundle-branch block, unspecified: Secondary | ICD-10-CM | POA: Diagnosis not present

## 2024-09-29 DIAGNOSIS — I4891 Unspecified atrial fibrillation: Secondary | ICD-10-CM | POA: Diagnosis not present

## 2024-09-29 DIAGNOSIS — I495 Sick sinus syndrome: Secondary | ICD-10-CM | POA: Diagnosis not present

## 2024-09-29 DIAGNOSIS — I447 Left bundle-branch block, unspecified: Secondary | ICD-10-CM | POA: Diagnosis not present

## 2024-10-10 DIAGNOSIS — I4891 Unspecified atrial fibrillation: Secondary | ICD-10-CM | POA: Diagnosis not present

## 2024-10-10 DIAGNOSIS — Z950 Presence of cardiac pacemaker: Secondary | ICD-10-CM | POA: Diagnosis not present

## 2024-10-21 ENCOUNTER — Other Ambulatory Visit: Payer: Self-pay | Admitting: Internal Medicine
